# Patient Record
Sex: Female | Born: 1985 | Race: White | Hispanic: No | Marital: Single | Smoking: Current every day smoker
Health system: Southern US, Community
[De-identification: ages and names within clinical notes are randomized; demographics above are authoritative.]

## PROBLEM LIST (undated history)

## (undated) DIAGNOSIS — M797 Fibromyalgia: Secondary | ICD-10-CM

## (undated) DIAGNOSIS — C7931 Secondary malignant neoplasm of brain: Secondary | ICD-10-CM

## (undated) DIAGNOSIS — C4490 Unspecified malignant neoplasm of skin, unspecified: Secondary | ICD-10-CM

## (undated) DIAGNOSIS — M1990 Unspecified osteoarthritis, unspecified site: Secondary | ICD-10-CM

## (undated) HISTORY — PX: CHOLECYSTECTOMY: SHX55

---

## 2004-12-07 ENCOUNTER — Emergency Department (HOSPITAL_COMMUNITY): Admission: EM | Admit: 2004-12-07 | Discharge: 2004-12-07 | Payer: Self-pay | Admitting: Emergency Medicine

## 2012-02-26 ENCOUNTER — Ambulatory Visit: Payer: Self-pay | Admitting: Internal Medicine

## 2012-02-26 DIAGNOSIS — Z0279 Encounter for issue of other medical certificate: Secondary | ICD-10-CM

## 2014-05-17 ENCOUNTER — Encounter (HOSPITAL_COMMUNITY): Payer: Self-pay | Admitting: Emergency Medicine

## 2014-05-17 ENCOUNTER — Emergency Department (HOSPITAL_COMMUNITY)
Admission: EM | Admit: 2014-05-17 | Discharge: 2014-05-17 | Disposition: A | Discharge status: Home / Self Care | Payer: Self-pay | Attending: Emergency Medicine | Admitting: Emergency Medicine

## 2014-05-17 DIAGNOSIS — Z8739 Personal history of other diseases of the musculoskeletal system and connective tissue: Secondary | ICD-10-CM | POA: Insufficient documentation

## 2014-05-17 DIAGNOSIS — Z880 Allergy status to penicillin: Secondary | ICD-10-CM | POA: Insufficient documentation

## 2014-05-17 DIAGNOSIS — R110 Nausea: Secondary | ICD-10-CM | POA: Insufficient documentation

## 2014-05-17 DIAGNOSIS — K089 Disorder of teeth and supporting structures, unspecified: Secondary | ICD-10-CM | POA: Insufficient documentation

## 2014-05-17 DIAGNOSIS — K047 Periapical abscess without sinus: Secondary | ICD-10-CM | POA: Insufficient documentation

## 2014-05-17 DIAGNOSIS — F17200 Nicotine dependence, unspecified, uncomplicated: Secondary | ICD-10-CM | POA: Insufficient documentation

## 2014-05-17 HISTORY — DX: Unspecified osteoarthritis, unspecified site: M19.90

## 2014-05-17 HISTORY — DX: Fibromyalgia: M79.7

## 2014-05-17 MED ORDER — HYDROCODONE-ACETAMINOPHEN 5-325 MG PO TABS: 1.0000 | ORAL_TABLET | Freq: Four times a day (QID) | ORAL | Status: DC | PRN

## 2014-05-17 MED ORDER — CLINDAMYCIN HCL 150 MG PO CAPS: 450.0000 mg | ORAL_CAPSULE | Freq: Three times a day (TID) | ORAL | Status: DC

## 2014-05-17 NOTE — ED Notes (Addendum)
Pt reports having right sided, mandibular dental pain that started yesterday. Pt reports awaking at 0030 this morning and noticed facial swelling. Pt reports a history of dental abscesses. Pt has obvious poor dental hygiene. Pt is A/O x4 and in NAD.

## 2014-05-17 NOTE — ED Provider Notes (Signed)
CSN: [id number]     Arrival date & time 05/17/14  1451 History  This chart was scribed for non-physician practitioner, Hyman Bible, PA-C,working with Ernestina Patches, MD, by Marlowe Kays, ED Scribe. This patient was seen in room [facility] and the patient's care was started at 3:29 PM.  Chief Complaint  Patient presents with  . Dental Pain   The history is provided by the patient. No language interpreter was used.   HPI Comments:  Julia Luna is a 28 y.o. female who presents to the Emergency Department complaining of moderate right-sided dental pain that has been present since yesterday and is gradually worsening. She reports associated right-sided facial swelling, nausea and chills. She has taken Ibuprofen without significant relief. Pt denies difficulty swallowing, fever and vomiting. She reports h/o dental abscesses. She does not have a Pharmacist, community.   Past Medical History  Diagnosis Date  . Fibromyalgia   . Osteoarthritis    Past Surgical History  Procedure Laterality Date  . Cholecystectomy    . Cesarean section     No family history on file. History  Substance Use Topics  . Smoking status: Current Every Day Smoker -- 0.75 packs/day for 10 years    Types: Cigarettes  . Smokeless tobacco: Never Used  . Alcohol Use: Yes     Comment: Socially    OB History   Grav Para Term Preterm Abortions TAB SAB Ect Mult Living                 Review of Systems  Constitutional: Positive for chills. Negative for fever.  HENT: Positive for dental problem and facial swelling.   Gastrointestinal: Positive for nausea. Negative for vomiting.  All other systems reviewed and are negative.   Allergies  Other and Penicillins  Home Medications   Prior to Admission medications   Not on File   Triage Vitals: BP 127/80  Pulse 109  Temp(Src) 98.9 F (37.2 C) (Oral)  Resp 18  SpO2 100%  LMP 04/26/2014 Physical Exam  Nursing note and vitals reviewed. Constitutional: She is  oriented to person, place, and time. She appears well-developed and well-nourished. No distress.  HENT:  Head: Normocephalic and atraumatic.  Mouth/Throat: Uvula is midline, oropharynx is clear and moist and mucous membranes are normal. No trismus in the jaw. Abnormal dentition. No dental abscesses or uvula swelling. No oropharyngeal exudate, posterior oropharyngeal edema, posterior oropharyngeal erythema or tonsillar abscesses.  Poor dental hygiene. Pt able to open and close mouth with out difficulty. Airway intact. Uvula midline. Moderate gingival swelling with tenderness over affected area, but no fluctuance. Right sided facial swelling.  No swelling or tenderness of submental and submandibular regions.  No sublingual tenderness or swelling.  No tongue elevation  Neck: Normal range of motion and full passive range of motion without pain. Neck supple.  Cardiovascular: Normal rate, regular rhythm and normal heart sounds.   Pulmonary/Chest: Effort normal and breath sounds normal. No stridor.  Musculoskeletal: Normal range of motion.  Lymphadenopathy:       Head (right side): No submental, no submandibular, no tonsillar, no preauricular and no posterior auricular adenopathy present.       Head (left side): No submental, no submandibular, no tonsillar, no preauricular and no posterior auricular adenopathy present.    She has no cervical adenopathy.  Neurological: She is alert and oriented to person, place, and time.  Skin: Skin is warm and dry. No rash noted. She is not diaphoretic.  Psychiatric: She has a normal  mood and affect. Her behavior is normal.    ED Course  Procedures (including critical care time) DIAGNOSTIC STUDIES: Oxygen Saturation is 100% on RA, normal by my interpretation.   COORDINATION OF CARE: 3:33 PM- Will refer to dentist and prescribe antibiotic and pain medication. Pt verbalizes understanding and agrees to plan.  Medications - No data to display  Labs Review Labs  Reviewed - No data to display  Imaging Review No results found.   EKG Interpretation None      MDM   Final diagnoses:  None   Patient with toothache.  No abscess visualized to incise and drain.  Exam unconcerning for Ludwig's angina or spread of infection.  Will treat with antibiotic and pain medicine.  Patient given Clindamycin due to allergy to PCN.  Urged patient to follow-up with dentist.  Return precautions given.   I personally performed the services described in this documentation, which was scribed in my presence. The recorded information has been reviewed and is accurate.    Hyman Bible, PA-C 05/17/14 1607

## 2014-05-17 NOTE — Discharge Instructions (Signed)
Take pain medication as needed for severe pain.  Do not drive or operate heavy machinery for 4-6 hours after taking pain medication. °

## 2014-05-18 NOTE — ED Provider Notes (Signed)
Medical screening examination/treatment/procedure(s) were performed by non-physician practitioner and as supervising physician I was immediately available for consultation/collaboration.   Ernestina Patches, MD 05/18/14 [phone]

## 2016-10-06 DIAGNOSIS — C7931 Secondary malignant neoplasm of brain: Secondary | ICD-10-CM

## 2016-10-06 HISTORY — DX: Secondary malignant neoplasm of brain: C79.31

## 2016-10-23 ENCOUNTER — Emergency Department (HOSPITAL_COMMUNITY): Payer: Self-pay

## 2016-10-23 ENCOUNTER — Encounter (HOSPITAL_COMMUNITY): Payer: Self-pay | Admitting: Emergency Medicine

## 2016-10-23 ENCOUNTER — Inpatient Hospital Stay (HOSPITAL_COMMUNITY)
Admission: EM | Admit: 2016-10-23 | Discharge: 2016-10-28 | DRG: 054 | Disposition: A | Discharge status: Home / Self Care | Payer: Self-pay | Attending: Internal Medicine | Admitting: Internal Medicine

## 2016-10-23 ENCOUNTER — Inpatient Hospital Stay (HOSPITAL_COMMUNITY): Payer: Self-pay

## 2016-10-23 DIAGNOSIS — Z7982 Long term (current) use of aspirin: Secondary | ICD-10-CM

## 2016-10-23 DIAGNOSIS — G9341 Metabolic encephalopathy: Secondary | ICD-10-CM | POA: Diagnosis present

## 2016-10-23 DIAGNOSIS — I459 Conduction disorder, unspecified: Secondary | ICD-10-CM | POA: Diagnosis present

## 2016-10-23 DIAGNOSIS — R519 Headache, unspecified: Secondary | ICD-10-CM

## 2016-10-23 DIAGNOSIS — C7961 Secondary malignant neoplasm of right ovary: Secondary | ICD-10-CM | POA: Diagnosis present

## 2016-10-23 DIAGNOSIS — M1990 Unspecified osteoarthritis, unspecified site: Secondary | ICD-10-CM | POA: Diagnosis present

## 2016-10-23 DIAGNOSIS — C7962 Secondary malignant neoplasm of left ovary: Secondary | ICD-10-CM | POA: Diagnosis present

## 2016-10-23 DIAGNOSIS — K5900 Constipation, unspecified: Secondary | ICD-10-CM

## 2016-10-23 DIAGNOSIS — J984 Other disorders of lung: Secondary | ICD-10-CM | POA: Diagnosis present

## 2016-10-23 DIAGNOSIS — R4182 Altered mental status, unspecified: Secondary | ICD-10-CM | POA: Diagnosis present

## 2016-10-23 DIAGNOSIS — R4189 Other symptoms and signs involving cognitive functions and awareness: Secondary | ICD-10-CM | POA: Diagnosis present

## 2016-10-23 DIAGNOSIS — R51 Headache: Secondary | ICD-10-CM | POA: Diagnosis present

## 2016-10-23 DIAGNOSIS — G939 Disorder of brain, unspecified: Secondary | ICD-10-CM

## 2016-10-23 DIAGNOSIS — Z791 Long term (current) use of non-steroidal anti-inflammatories (NSAID): Secondary | ICD-10-CM

## 2016-10-23 DIAGNOSIS — Z880 Allergy status to penicillin: Secondary | ICD-10-CM

## 2016-10-23 DIAGNOSIS — Z808 Family history of malignant neoplasm of other organs or systems: Secondary | ICD-10-CM

## 2016-10-23 DIAGNOSIS — R232 Flushing: Secondary | ICD-10-CM | POA: Diagnosis present

## 2016-10-23 DIAGNOSIS — E876 Hypokalemia: Secondary | ICD-10-CM | POA: Diagnosis present

## 2016-10-23 DIAGNOSIS — C799 Secondary malignant neoplasm of unspecified site: Secondary | ICD-10-CM

## 2016-10-23 DIAGNOSIS — Z79899 Other long term (current) drug therapy: Secondary | ICD-10-CM

## 2016-10-23 DIAGNOSIS — G9340 Encephalopathy, unspecified: Secondary | ICD-10-CM | POA: Diagnosis present

## 2016-10-23 DIAGNOSIS — G936 Cerebral edema: Secondary | ICD-10-CM | POA: Diagnosis present

## 2016-10-23 DIAGNOSIS — G9389 Other specified disorders of brain: Secondary | ICD-10-CM

## 2016-10-23 DIAGNOSIS — F17210 Nicotine dependence, cigarettes, uncomplicated: Secondary | ICD-10-CM | POA: Diagnosis present

## 2016-10-23 DIAGNOSIS — C439 Malignant melanoma of skin, unspecified: Secondary | ICD-10-CM | POA: Diagnosis present

## 2016-10-23 DIAGNOSIS — R591 Generalized enlarged lymph nodes: Secondary | ICD-10-CM | POA: Diagnosis present

## 2016-10-23 DIAGNOSIS — C7989 Secondary malignant neoplasm of other specified sites: Secondary | ICD-10-CM | POA: Diagnosis present

## 2016-10-23 DIAGNOSIS — C7971 Secondary malignant neoplasm of right adrenal gland: Secondary | ICD-10-CM | POA: Diagnosis present

## 2016-10-23 DIAGNOSIS — R112 Nausea with vomiting, unspecified: Secondary | ICD-10-CM

## 2016-10-23 DIAGNOSIS — C7931 Secondary malignant neoplasm of brain: Principal | ICD-10-CM | POA: Diagnosis present

## 2016-10-23 DIAGNOSIS — R2689 Other abnormalities of gait and mobility: Secondary | ICD-10-CM

## 2016-10-23 DIAGNOSIS — Z79891 Long term (current) use of opiate analgesic: Secondary | ICD-10-CM

## 2016-10-23 DIAGNOSIS — R001 Bradycardia, unspecified: Secondary | ICD-10-CM | POA: Diagnosis present

## 2016-10-23 DIAGNOSIS — M797 Fibromyalgia: Secondary | ICD-10-CM | POA: Diagnosis present

## 2016-10-23 DIAGNOSIS — F17200 Nicotine dependence, unspecified, uncomplicated: Secondary | ICD-10-CM | POA: Diagnosis present

## 2016-10-23 DIAGNOSIS — C7972 Secondary malignant neoplasm of left adrenal gland: Secondary | ICD-10-CM | POA: Diagnosis present

## 2016-10-23 LAB — CBG MONITORING, ED: Glucose-Capillary: 111 mg/dL — ABNORMAL HIGH (ref 65–99)

## 2016-10-23 LAB — COMPREHENSIVE METABOLIC PANEL
ALBUMIN: 3.9 g/dL (ref 3.5–5.0)
ALT: 26 U/L (ref 14–54)
AST: 21 U/L (ref 15–41)
Alkaline Phosphatase: 67 U/L (ref 38–126)
Anion gap: 11 (ref 5–15)
BILIRUBIN TOTAL: 0.6 mg/dL (ref 0.3–1.2)
BUN: 6 mg/dL (ref 6–20)
CO2: 23 mmol/L (ref 22–32)
CREATININE: 0.75 mg/dL (ref 0.44–1.00)
Calcium: 9.2 mg/dL (ref 8.9–10.3)
Chloride: 103 mmol/L (ref 101–111)
GFR calc Af Amer: >60 mL/min (ref >60)
GLUCOSE: 101 mg/dL — AB (ref 65–99)
POTASSIUM: 3.2 mmol/L — AB (ref 3.5–5.1)
Sodium: 137 mmol/L (ref 135–145)
TOTAL PROTEIN: 7 g/dL (ref 6.5–8.1)

## 2016-10-23 LAB — CBC
HEMATOCRIT: 37.5 % (ref 36.0–46.0)
Hemoglobin: 13 g/dL (ref 12.0–15.0)
MCH: 31.2 pg (ref 26.0–34.0)
MCHC: 34.7 g/dL (ref 30.0–36.0)
MCV: 89.9 fL (ref 78.0–100.0)
PLATELETS: 235 10*3/uL (ref 150–400)
RBC: 4.17 MIL/uL (ref 3.87–5.11)
RDW: 12.9 % (ref 11.5–15.5)
WBC: 10.7 10*3/uL — AB (ref 4.0–10.5)

## 2016-10-23 LAB — I-STAT BETA HCG BLOOD, ED (MC, WL, AP ONLY): I-stat hCG, quantitative: <5 m[IU]/mL (ref <5)

## 2016-10-23 MED ORDER — POTASSIUM CHLORIDE IN NACL 20-0.9 MEQ/L-% IV SOLN
INTRAVENOUS | Status: DC
  Administered 2016-10-23 – 2016-10-26 (×6): via INTRAVENOUS
  Filled 2016-10-23 (×8): qty 1000

## 2016-10-23 MED ORDER — HYDROMORPHONE HCL 2 MG/ML IJ SOLN
1.0000 mg | INTRAMUSCULAR | Status: DC | PRN
  Administered 2016-10-23 – 2016-10-24 (×3): 1 mg via INTRAVENOUS
  Filled 2016-10-23 (×3): qty 1

## 2016-10-23 MED ORDER — ONDANSETRON HCL 4 MG/2ML IJ SOLN
4.0000 mg | Freq: Four times a day (QID) | INTRAMUSCULAR | Status: DC | PRN
  Administered 2016-10-26 – 2016-10-27 (×2): 4 mg via INTRAVENOUS
  Filled 2016-10-23: qty 2

## 2016-10-23 MED ORDER — FENTANYL CITRATE (PF) 100 MCG/2ML IJ SOLN
100.0000 ug | Freq: Once | INTRAMUSCULAR | Status: AC
  Administered 2016-10-23: 100 ug via INTRAVENOUS
  Filled 2016-10-23: qty 2

## 2016-10-23 MED ORDER — DEXAMETHASONE SODIUM PHOSPHATE 10 MG/ML IJ SOLN
4.0000 mg | Freq: Four times a day (QID) | INTRAMUSCULAR | Status: DC
  Administered 2016-10-23 – 2016-10-25 (×8): 4 mg via INTRAVENOUS
  Filled 2016-10-23 (×12): qty 1

## 2016-10-23 MED ORDER — LORAZEPAM 2 MG/ML IJ SOLN
1.0000 mg | INTRAMUSCULAR | Status: AC
  Administered 2016-10-23: 1 mg via INTRAVENOUS
  Filled 2016-10-23: qty 1

## 2016-10-23 MED ORDER — DEXAMETHASONE SODIUM PHOSPHATE 10 MG/ML IJ SOLN
10.0000 mg | Freq: Once | INTRAMUSCULAR | Status: AC
  Administered 2016-10-23: 10 mg via INTRAVENOUS
  Filled 2016-10-23: qty 1

## 2016-10-23 MED ORDER — ONDANSETRON HCL 4 MG PO TABS: 4.0000 mg | ORAL_TABLET | Freq: Four times a day (QID) | ORAL | Status: DC | PRN

## 2016-10-23 MED ORDER — IOPAMIDOL (ISOVUE-300) INJECTION 61%
INTRAVENOUS | Status: AC
  Administered 2016-10-23: 100 mL
  Filled 2016-10-23: qty 100

## 2016-10-23 MED ORDER — GADOBENATE DIMEGLUMINE 529 MG/ML IV SOLN
20.0000 mL | Freq: Once | INTRAVENOUS | Status: AC | PRN
  Administered 2016-10-23: 20 mL via INTRAVENOUS

## 2016-10-23 MED ORDER — SODIUM CHLORIDE 0.9% FLUSH
3.0000 mL | Freq: Two times a day (BID) | INTRAVENOUS | Status: DC
  Administered 2016-10-23 – 2016-10-28 (×7): 3 mL via INTRAVENOUS

## 2016-10-23 NOTE — ED Notes (Signed)
Patient transported to MRI 

## 2016-10-23 NOTE — Care Management Note (Signed)
Case Management Note  Patient Details  Name: MERIAN WROE MRN: [id number] Date of Birth: 1986-01-16  Subjective/Objective:      Patient presented to [facility] ED with c/o AMS HA, Difficulty walking, Head CT done in the ED reveal brain mass.                Action/Plan: ED CM reviewed patient's record, no PCP or health insurance listed. CM met with patient and parents at beside information was confirmed. 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 Parents are concerned over patient not having insurance. CM explained that a [place] will contact patient  while in hospital. Patient may need assistance with [facility] program, and follow up. Unit CM will follow up for transitional care planning.  Expected Discharge Date:                  Expected Discharge Plan:  Home/Self Care  In-House Referral:     Discharge planning Services  CM Consult, [place] [facility], [facility]  Post Acute Care Choice:    Choice offered to:  Patient  DME Arranged:    DME Agency:     HH Arranged:    [place] Agency:     Status of Service:  In process, will continue to follow  If discussed at Long Length of Stay Meetings, dates discussed:    Additional CommentsLaurena Slimmer, RN 10/23/2016, 9:54 PM

## 2016-10-23 NOTE — ED Notes (Signed)
Pt aware of need for urine  

## 2016-10-23 NOTE — ED Notes (Signed)
Patient transported to CT 

## 2016-10-23 NOTE — ED Triage Notes (Signed)
Pt here with family with some lethargy and AMS; pt sts frontal HA; family feels that she is confused; pt alert and oriented at present

## 2016-10-23 NOTE — ED Notes (Signed)
Placed pt on bedpan was unable to give urine at this time.

## 2016-10-23 NOTE — H&P (Signed)
History and Physical    [name] [id number] DOB: [birth year] DOA: 10/23/2016  PCP: No PCP Per Patient   Patient coming from: Home.  Chief Complaint: Altered mental status.  HPI: Julia Luna is a 31 y.o. female with medical history significant of fibromyalgia, osteoarthritis who comes to the ED brought by her parents with progressively worse altered mental status and headache for several days.   Apparently, the patient has not been behaving like usual. They got really concerned, when she missed work on Tuesday, which is very unusual for her. The patient has been having headaches, chills, but no fever. She is unable to provide further history at this time.   ED Course: CT scan of the head showed lesions consistent with metastasis. WBC was 10.7, potassium 3.2 mmol/L and glucose 101 mg/dL. All other values were unremarkable.  Review of Systems: As per HPI otherwise 10 point review of systems negative.    Past Medical History:  Diagnosis Date  . Fibromyalgia   . Osteoarthritis     Past Surgical History:  Procedure Laterality Date  . CESAREAN SECTION    . CHOLECYSTECTOMY       reports that she has been smoking Cigarettes.  She has a 7.50 pack-year smoking history. She has never used smokeless tobacco. She reports that she drinks alcohol. She reports that she does not use drugs.  Allergies  Allergen Reactions  . Other Hives    NO "-CILLIN(s)"  . Penicillins Hives    Has patient had a PCN reaction causing immediate rash, facial/tongue/throat swelling, SOB or lightheadedness with hypotension: Yes Has patient had a PCN reaction causing severe rash involving mucus membranes or skin necrosis: No Has patient had a PCN reaction that required hospitalization: No Has patient had a PCN reaction occurring within the last 10 years: No If all of the above answers are "NO", then may proceed with Cephalosporin use.     Family History  Problem Relation Age of Onset  . Thyroid  cancer Mother   . Cardiomyopathy Father     Prior to Admission medications   Medication Sig Start Date End Date Taking? Authorizing Provider  aspirin EC 325 MG tablet Take 325 mg by mouth daily as needed (for headaches).    Yes Historical Provider, MD  aspirin-acetaminophen-caffeine (EXCEDRIN MIGRAINE) [phone] MG tablet Take 1-2 tablets by mouth every 6 (six) hours as needed for headache or migraine.   Yes Historical Provider, MD  ibuprofen (ADVIL,MOTRIN) 200 MG tablet Take 400-600 mg by mouth every 6 (six) hours as needed for headache (or pain).    Yes Historical Provider, MD  clindamycin (CLEOCIN) 150 MG capsule Take 3 capsules (450 mg total) by mouth 3 (three) times daily. Patient not taking: Reported on 10/23/2016 05/17/14   Hyman Bible, PA-C  HYDROcodone-acetaminophen (NORCO/VICODIN) 5-325 MG per tablet Take 1-2 tablets by mouth every 6 (six) hours as needed. Patient not taking: Reported on 10/23/2016 05/17/14   Hyman Bible, PA-C    Physical Exam:  Constitutional: NAD, calm, comfortable Vitals:   10/23/16 1700 10/23/16 1715 10/23/16 1800 10/23/16 1900  BP: 132/72 114/79 136/83 144/87  Pulse: (!) 49 (!) 51 (!) 50 (!) 52  Resp: 16 17 16 15   Temp:      TempSrc:      SpO2: 97% 95% 98% 100%   Eyes: PERRL, lids and conjunctivae normal ENMT: Mucous membranes are mildly dry. Posterior pharynx clear of any exudate or lesions.dentures.  Neck: normal, supple, no masses, no thyromegaly Respiratory:  Clear to auscultation bilaterally, no wheezing, no crackles. Normal respiratory effort. No accessory muscle use.  Cardiovascular: Bradycardic at 56 BPM, no murmurs / rubs / gallops. No extremity edema. 2+ pedal pulses. No carotid bruits.  Abdomen: Mild distention, soft, no tenderness, no masses palpated. No hepatosplenomegaly. Bowel sounds positive.  Musculoskeletal: no clubbing / cyanosis.Good passive ROM, no contractures. Normal muscle tone.  Skin: facial erythema. Neurologic: CN 2-12  grossly intact. Sensation intact, DTR normal. Global Weakness  Psychiatric: Alert and oriented x 2, partially oriented to time and situation.   Labs on Admission: I have personally reviewed following labs and imaging studies  CBC:  Recent Labs Lab 10/23/16 1526  WBC 10.7*  HGB 13.0  HCT 37.5  MCV 89.9  PLT [id number]   Basic Metabolic Panel:  Recent Labs Lab 10/23/16 1526  NA 137  K 3.2*  CL 103  CO2 23  GLUCOSE 101*  BUN 6  CREATININE 0.75  CALCIUM 9.2   GFR: CrCl cannot be calculated (Unknown ideal weight.). Liver Function Tests:  Recent Labs Lab 10/23/16 1526  AST 21  ALT 26  ALKPHOS 67  BILITOT 0.6  PROT 7.0  ALBUMIN 3.9   No results for input(s): LIPASE, AMYLASE in the last 168 hours. No results for input(s): AMMONIA in the last 168 hours. Coagulation Profile: No results for input(s): INR, PROTIME in the last 168 hours. Cardiac Enzymes: No results for input(s): CKTOTAL, CKMB, CKMBINDEX, TROPONINI in the last 168 hours. BNP (last 3 results) No results for input(s): PROBNP in the last 8760 hours. HbA1C: No results for input(s): HGBA1C in the last 72 hours. CBG:  Recent Labs Lab 10/23/16 1702  GLUCAP 111*   Lipid Profile: No results for input(s): CHOL, HDL, LDLCALC, TRIG, CHOLHDL, LDLDIRECT in the last 72 hours. Thyroid Function Tests: No results for input(s): TSH, T4TOTAL, FREET4, T3FREE, THYROIDAB in the last 72 hours. Anemia Panel: No results for input(s): VITAMINB12, FOLATE, FERRITIN, TIBC, IRON, RETICCTPCT in the last 72 hours. Urine analysis: No results found for: COLORURINE, APPEARANCEUR, [place], [place], GLUCOSEU, HGBUR, BILIRUBINUR, KETONESUR, PROTEINUR, UROBILINOGEN, NITRITE, LEUKOCYTESUR   Radiological Exams on Admission: Ct Head Wo Contrast  Result Date: 10/23/2016 CLINICAL DATA:  Intermittent headache for 1 week with vomiting. EXAM: CT HEAD WITHOUT CONTRAST TECHNIQUE: Contiguous axial images were obtained from the base of the skull  through the vertex without intravenous contrast. COMPARISON:  12/07/2004 FINDINGS: Brain: There are multiple apparent hyperattenuating masses along the corticomedullary junctions of both cerebral hemispheres. In the right frontal lobe there is a 19 mm lesion. In the left posteromedial parietal lobe there is an 18 mm lesion. In the right parietal lobe superiorly and medially there is a 2.3 cm lesion. There multiple additional smaller lesions. There is surrounding white matter hypoattenuation consistent with vasogenic edema. There is no midline shift. There is some mass effect reflected by sulcal effacement. The ventricles are normal in overall size and configuration. There is a questionable small lesion in the right cerebellum. There is no evidence of an infarct. There are no extra-axial masses or abnormal fluid collections. There is no intracranial hemorrhage. Vascular: No hyperdense vessel or unexpected calcification. Skull: Normal. Negative for fracture or focal lesion. Sinuses/Orbits: Globes and orbits unremarkable. Visualized sinuses are essentially clear. Other: None. IMPRESSION: 1. Now multiple relatively hyperattenuating brain masses with associated vasogenic edema consistent with metastatic disease. Followup brain MRI with and without contrast is recommended. Electronically Signed   By: Lajean Manes M.D.   On: 10/23/2016 16:19  EKG: Independently reviewed. Ordered.  Assessment/Plan Principal Problem:   Brain mass Continue Decadron. Frequent neuro checks Will get a CT scan of the chest/abdomen/pelvis to try to find primary. 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 Consult oncology, GYN and/or interventional radiology in a.m. Consider neurology evaluation if no improvement of mental status with IV dexamethasone.  Active Problems:   Hypokalemia Replacing. Check magnesium level.    Bradycardia Will get EKG in the morning.    Tobacco  use disorder Nicotine replacement therapy as needed.    DVT prophylaxis: Lovenox. Code Status: Full code. Family Communication: Her parent were present in the room and provided history. Disposition Plan: Admit for further work up and symptoms treatment. Consults called:  Admission status: Inpatient/Telemetry.   Reubin Milan MD [facility] Pager [phone]  If 7PM-7AM, please contact night-coverage [email or web address] Password [email or web address]  10/23/2016, 8:02 PM   Late entry  CLINICAL DATA:  Diagnosed with brain mass today.  EXAM: CT CHEST, ABDOMEN, AND PELVIS WITH CONTRAST  TECHNIQUE: Multidetector CT imaging of the chest, abdomen and pelvis was performed following the standard protocol during bolus administration of intravenous contrast.  CONTRAST:  11mL ISOVUE-300 IOPAMIDOL (ISOVUE-300) INJECTION 61%  COMPARISON:  None.  FINDINGS: CT CHEST FINDINGS  Cardiovascular: Heart size is normal. No pericardial effusion. Thoracic aorta is normal in caliber.  Mediastinum/Nodes: No mass or enlarged lymph nodes seen within the mediastinum, perihilar or axillary regions. Esophagus appears normal. Thyroid gland appears normal. Trachea and central bronchi are unremarkable.  Lungs/Pleura: Scattered pulmonary nodules scattered throughout both lungs, at least 10 on the right and at least 7 on the left.  Largest pulmonary nodule is within the right upper lobe measuring 2.2 x 2.2 x 1.7 cm (series 40, image 54). Largest nodule on the left is located within the left upper lobe, inferior segment posteriorly, measuring 2 x 1.6 x 1.5 cm (series 4, image 47).  There are also small bilateral effusions with adjacent atelectasis.  Musculoskeletal: No acute or suspicious osseous finding. Superficial soft tissues are unremarkable.  CT ABDOMEN PELVIS FINDINGS  Hepatobiliary: Status post cholecystectomy. No focal mass or lesion within the liver. No bile duct  dilatation.  Pancreas: Unremarkable. No pancreatic ductal dilatation or surrounding inflammatory changes.  Spleen: Normal in size without focal abnormality.  Adrenals/Urinary Tract: Bilateral adrenal masses, measuring approximately 2.4 x 2.2 cm on the left and approximately 2 x 1.4 cm on the right.  Both kidneys appear normal without mass, stone or hydronephrosis. No ureteral or bladder calculi identified. Bladder appears normal.  Stomach/Bowel: Bowel is normal in caliber. No bowel wall thickening or evidence of bowel wall inflammation seen. Appendix appears normal. Stomach appears normal.  Vascular/Lymphatic: No vascular abnormality appreciated. Scattered small lymph nodes noted within the bilateral iliac chain regions and retroperitoneum. No pathologically enlarged lymph nodes appreciated within the abdomen or pelvis.  There is an enlarged/morphologically abnormal lymph node within the left inguinal region, with central hypodensity suspicious for necrosis, measuring 3.5 x 2.3 cm.  Reproductive: Solid-appearing mass within the left adnexal region measures 3.2 x 2.3 cm, likely contiguous with the left ovary. Additional cystic-appearing mass within the right adnexal region measures 6.6 x 4.2 cm, likely contiguous with the right ovary.  Small amount of free fluid within the adjacent cul-de-sac.  Other: None  Musculoskeletal: No acute or suspicious osseous finding.  IMPRESSION: 1. Multiple scattered pulmonary nodules within each lung, consistent with metastases. Largest nodule is located within the right  upper lobe measuring 2.2 x 2.2 x 1.7 cm. 2. Small bilateral pleural effusions. 3. Solid-appearing mass within the left adnexal region measuring 3.2 x 2.3 cm. Additional cystic-appearing mass within the right adnexal region measuring 6.6 x 4.2 cm. 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 4. Enlarged/morphologically abnormal lymph node  within the left inguinal region, with central hypodensity suspicious for necrosis, highly suspicious for metastatic lymph node. 5. Bilateral adrenal masses, also suspicious for metastatic disease.   Electronically Signed   By: Franki Cabot M.D.   On: 10/23/2016 22:26

## 2016-10-23 NOTE — ED Provider Notes (Signed)
[facility] DEPT Provider Note   CSN: [id number] Arrival date & time: 10/23/16  1453     History   Chief Complaint Chief Complaint  Patient presents with  . Headache    HPI VANITA GENSEMER is a 31 y.o. female.  HPI patient presents with headache and some confusion and difficulty walking. Reportedly has been going on for about the last week. Brought in by family members. Has had a dull headache. States it has come and gone. No trauma. Has had some chills but no fevers. Reportedly gets a little flushed and then will get something cold and then she'll get goosebumps. No weight loss. No nausea or vomiting. Does have a history of fibromyalgia and has some chronic joint pain. She is a smoker. Has had difficulty walking and his been unsteady. No history of malignancy. Denies possibility of pregnancy.  Past Medical History:  Diagnosis Date  . Fibromyalgia   . Osteoarthritis     There are no active problems to display for this patient.   Past Surgical History:  Procedure Laterality Date  . CESAREAN SECTION    . CHOLECYSTECTOMY      OB History    No data available       Home Medications    Prior to Admission medications   Medication Sig Start Date End Date Taking? Authorizing Provider  clindamycin (CLEOCIN) 150 MG capsule Take 3 capsules (450 mg total) by mouth 3 (three) times daily. 05/17/14   Hyman Bible, PA-C  HYDROcodone-acetaminophen (NORCO/VICODIN) 5-325 MG per tablet Take 1-2 tablets by mouth every 6 (six) hours as needed. 05/17/14   Heather Laisure, PA-C  ibuprofen (ADVIL,MOTRIN) 200 MG tablet Take 400-600 mg by mouth every 6 (six) hours as needed (For pain.).    Historical Provider, MD    Family History History reviewed. No pertinent family history.  Social History Social History  Substance Use Topics  . Smoking status: Current Every Day Smoker    Packs/day: 0.75    Years: 10.00    Types: Cigarettes  . Smokeless tobacco: Never Used  . Alcohol use Yes       Comment: Socially      Allergies   Other and Penicillins   Review of Systems Review of Systems  Constitutional: Positive for chills. Negative for appetite change.  HENT: Negative for congestion.   Eyes: Negative for photophobia and visual disturbance.  Cardiovascular: Negative for chest pain.  Gastrointestinal: Negative for abdominal pain.  Genitourinary: Negative for flank pain.  Musculoskeletal: Positive for arthralgias. Negative for back pain.  Skin: Positive for rash.  Neurological: Positive for weakness and headaches. Negative for speech difficulty.  Hematological: Negative for adenopathy.     Physical Exam Updated Vital Signs BP 124/79 (BP Location: Right Arm)   Pulse (!) 57   Temp 98.4 F (36.9 C) (Oral)   Resp 16   SpO2 100%   Physical Exam  Constitutional: She appears well-developed.  HENT:  Head: Normocephalic.  Eyes: Pupils are equal, round, and reactive to light.  Neck: Neck supple.  Cardiovascular: Normal rate.   Pulmonary/Chest: Effort normal.  Abdominal: There is no tenderness.  Musculoskeletal: She exhibits no edema.  Neurological: She is alert.  Patient is awake but somewhat slow to answer. She is appropriate and able to recognize her family. She did however have to look over to see who was in the room with her. Finger-nose intact but slow.  Skin: Skin is warm. Capillary refill takes less than 2 seconds.  ED Treatments / Results  Labs (all labs ordered are listed, but only abnormal results are displayed) Labs Reviewed  COMPREHENSIVE METABOLIC PANEL - Abnormal; Notable for the following:       Result Value   Potassium 3.2 (*)    Glucose, Bld 101 (*)    All other components within normal limits  CBC - Abnormal; Notable for the following:    WBC 10.7 (*)    All other components within normal limits  PREGNANCY, URINE  URINALYSIS, ROUTINE W REFLEX MICROSCOPIC  CBG MONITORING, ED    EKG  EKG Interpretation None        Radiology Ct Head Wo Contrast  Result Date: 10/23/2016 CLINICAL DATA:  Intermittent headache for 1 week with vomiting. EXAM: CT HEAD WITHOUT CONTRAST TECHNIQUE: Contiguous axial images were obtained from the base of the skull through the vertex without intravenous contrast. COMPARISON:  12/07/2004 FINDINGS: Brain: There are multiple apparent hyperattenuating masses along the corticomedullary junctions of both cerebral hemispheres. In the right frontal lobe there is a 19 mm lesion. In the left posteromedial parietal lobe there is an 18 mm lesion. In the right parietal lobe superiorly and medially there is a 2.3 cm lesion. There multiple additional smaller lesions. There is surrounding white matter hypoattenuation consistent with vasogenic edema. There is no midline shift. There is some mass effect reflected by sulcal effacement. The ventricles are normal in overall size and configuration. There is a questionable small lesion in the right cerebellum. There is no evidence of an infarct. There are no extra-axial masses or abnormal fluid collections. There is no intracranial hemorrhage. Vascular: No hyperdense vessel or unexpected calcification. Skull: Normal. Negative for fracture or focal lesion. Sinuses/Orbits: Globes and orbits unremarkable. Visualized sinuses are essentially clear. Other: None. IMPRESSION: 1. Now multiple relatively hyperattenuating brain masses with associated vasogenic edema consistent with metastatic disease. Followup brain MRI with and without contrast is recommended. Electronically Signed   By: Lajean Manes M.D.   On: 10/23/2016 16:19    Procedures Procedures (including critical care time)  Medications Ordered in ED Medications  fentaNYL (SUBLIMAZE) injection 100 mcg (not administered)     Initial Impression / Assessment and Plan / ED Course  I have reviewed the triage vital signs and the nursing notes.  Pertinent labs & imaging results that were available during my  care of the patient were reviewed by me and considered in my medical decision making (see chart for details).     Patient with headaches. Some confusion. Has been going for the last week. Smoker but no malignancy history. Likely tumors on MRI. No fevers but had had a little bit of chills. No primary care doctor. Will admit to internal medicine.  Final Clinical Impressions(s) / ED Diagnoses   Final diagnoses:  Brain mass  Acute nonintractable headache, unspecified headache type    New Prescriptions New Prescriptions   No medications on file     Davonna Belling, MD 10/23/16 1704

## 2016-10-24 ENCOUNTER — Inpatient Hospital Stay (HOSPITAL_COMMUNITY): Payer: Self-pay

## 2016-10-24 ENCOUNTER — Encounter (HOSPITAL_COMMUNITY): Payer: Self-pay | Admitting: General Practice

## 2016-10-24 DIAGNOSIS — N839 Noninflammatory disorder of ovary, fallopian tube and broad ligament, unspecified: Secondary | ICD-10-CM

## 2016-10-24 DIAGNOSIS — R51 Headache: Secondary | ICD-10-CM

## 2016-10-24 DIAGNOSIS — R911 Solitary pulmonary nodule: Secondary | ICD-10-CM

## 2016-10-24 DIAGNOSIS — C799 Secondary malignant neoplasm of unspecified site: Secondary | ICD-10-CM

## 2016-10-24 DIAGNOSIS — R4182 Altered mental status, unspecified: Secondary | ICD-10-CM

## 2016-10-24 DIAGNOSIS — R599 Enlarged lymph nodes, unspecified: Secondary | ICD-10-CM

## 2016-10-24 DIAGNOSIS — R410 Disorientation, unspecified: Secondary | ICD-10-CM

## 2016-10-24 DIAGNOSIS — E279 Disorder of adrenal gland, unspecified: Secondary | ICD-10-CM

## 2016-10-24 LAB — CBC WITH DIFFERENTIAL/PLATELET
Basophils Absolute: 0 K/uL (ref 0.0–0.1)
Basophils Relative: 0 %
Eosinophils Absolute: 0 K/uL (ref 0.0–0.7)
Eosinophils Relative: 0 %
HCT: 38.6 % (ref 36.0–46.0)
Hemoglobin: 13.4 g/dL (ref 12.0–15.0)
Lymphocytes Relative: 16 %
Lymphs Abs: 1.1 K/uL (ref 0.7–4.0)
MCH: 31.1 pg (ref 26.0–34.0)
MCHC: 34.7 g/dL (ref 30.0–36.0)
MCV: 89.6 fL (ref 78.0–100.0)
Monocytes Absolute: 0.3 K/uL (ref 0.1–1.0)
Monocytes Relative: 4 %
Neutro Abs: 5.6 K/uL (ref 1.7–7.7)
Neutrophils Relative %: 80 %
Platelets: 211 K/uL (ref 150–400)
RBC: 4.31 MIL/uL (ref 3.87–5.11)
RDW: 12.8 % (ref 11.5–15.5)
WBC: 7 K/uL (ref 4.0–10.5)

## 2016-10-24 LAB — URINALYSIS, ROUTINE W REFLEX MICROSCOPIC
Bilirubin Urine: NEGATIVE
GLUCOSE, UA: NEGATIVE mg/dL
Hgb urine dipstick: NEGATIVE
Ketones, ur: 20 mg/dL — AB
LEUKOCYTES UA: NEGATIVE
Nitrite: NEGATIVE
PH: 6 (ref 5.0–8.0)
Protein, ur: NEGATIVE mg/dL
Specific Gravity, Urine: >1.046 — ABNORMAL HIGH (ref 1.005–1.030)

## 2016-10-24 LAB — BASIC METABOLIC PANEL WITH GFR
Anion gap: 11 (ref 5–15)
BUN: 8 mg/dL (ref 6–20)
CO2: 23 mmol/L (ref 22–32)
Calcium: 8.5 mg/dL — ABNORMAL LOW (ref 8.9–10.3)
Chloride: 103 mmol/L (ref 101–111)
Creatinine, Ser: 0.74 mg/dL (ref 0.44–1.00)
GFR calc Af Amer: >60 mL/min
GFR calc non Af Amer: >60 mL/min
Glucose, Bld: 110 mg/dL — ABNORMAL HIGH (ref 65–99)
Potassium: 3.5 mmol/L (ref 3.5–5.1)
Sodium: 137 mmol/L (ref 135–145)

## 2016-10-24 LAB — PROTIME-INR
INR: 1.15
PROTHROMBIN TIME: 14.8 s (ref 11.4–15.2)

## 2016-10-24 LAB — MAGNESIUM: Magnesium: 2.2 mg/dL (ref 1.7–2.4)

## 2016-10-24 MED ORDER — MIDAZOLAM HCL 2 MG/2ML IJ SOLN
INTRAMUSCULAR | Status: AC | PRN
  Administered 2016-10-24: 1 mg via INTRAVENOUS

## 2016-10-24 MED ORDER — MIDAZOLAM HCL 2 MG/2ML IJ SOLN
INTRAMUSCULAR | Status: AC
  Filled 2016-10-24: qty 2

## 2016-10-24 MED ORDER — MAGNESIUM SULFATE 2 GM/50ML IV SOLN
2.0000 g | Freq: Once | INTRAVENOUS | Status: AC
  Administered 2016-10-24: 2 g via INTRAVENOUS
  Filled 2016-10-24: qty 50

## 2016-10-24 MED ORDER — LIDOCAINE HCL 1 % IJ SOLN
INTRAMUSCULAR | Status: AC
  Filled 2016-10-24: qty 20

## 2016-10-24 MED ORDER — HYDROMORPHONE HCL 2 MG/ML IJ SOLN
1.0000 mg | INTRAMUSCULAR | Status: DC | PRN
  Administered 2016-10-24 – 2016-10-25 (×5): 1 mg via INTRAVENOUS
  Filled 2016-10-24 (×6): qty 1

## 2016-10-24 MED ORDER — FENTANYL CITRATE (PF) 100 MCG/2ML IJ SOLN
INTRAMUSCULAR | Status: AC
  Filled 2016-10-24: qty 2

## 2016-10-24 MED ORDER — FENTANYL CITRATE (PF) 100 MCG/2ML IJ SOLN
INTRAMUSCULAR | Status: AC | PRN
  Administered 2016-10-24: 25 ug via INTRAVENOUS

## 2016-10-24 MED ORDER — OXYCODONE-ACETAMINOPHEN 5-325 MG PO TABS
1.0000 | ORAL_TABLET | Freq: Four times a day (QID) | ORAL | Status: DC | PRN
  Administered 2016-10-24 (×2): 1 via ORAL
  Filled 2016-10-24 (×3): qty 1

## 2016-10-24 NOTE — Progress Notes (Signed)
Patient unable to void after being admitted last night. Patient stated that she felt that she needed to pee but couldn't. Bladder scan showed >571 mL of urine in bladder. Paged [place],MD about patient. MD placed order for in and out cath. 2 RNs completed in and out cath. 632mL of urine was drained from patient's bladder.

## 2016-10-24 NOTE — Progress Notes (Signed)
Chief Complaint: Patient was seen in consultation today for biopsy of LN at the request of Dr. Daleen Bo  Referring Physician(s): Dr. Ivette Loyal  Supervising Physician: Marybelle Killings  Patient Status: [facility] - In-pt  History of Present Illness: Julia Luna is a 31 y.o. female who has been admitted with altered mental status and headache. Her workup is now suspicious for metastatic process with findings of lesions in the pelvis, lung, brain , and lymph nodes. IR is asked to obtain tissue sampling. Chart, imaging, meds, labs, allergies reviewed. Dr. Barbie Banner feels large (L)inguinal is amenable for core biopsy. Parents at bedside. Pt s/o mild nausea after getting up tom commode. She did eat some breakfast this am before 0900.  Past Medical History:  Diagnosis Date  . Fibromyalgia   . Osteoarthritis     Past Surgical History:  Procedure Laterality Date  . CESAREAN SECTION    . CHOLECYSTECTOMY      Allergies: Other and Penicillins  Medications:  Current Facility-Administered Medications:  .  0.9 % NaCl with KCl 20 mEq/ L  infusion, , Intravenous, Continuous, Kinnie Feil, MD, Last Rate: 75 mL/hr at 10/24/16 0928 .  dexamethasone (DECADRON) injection 4 mg, 4 mg, Intravenous, Q6H, Reubin Milan, MD, 4 mg at 10/24/16 0511 .  HYDROmorphone (DILAUDID) injection 1 mg, 1 mg, Intravenous, Q4H PRN, Reubin Milan, MD, 1 mg at 10/24/16 [id number] .  ondansetron (ZOFRAN) tablet 4 mg, 4 mg, Oral, Q6H PRN **OR** ondansetron (ZOFRAN) injection 4 mg, 4 mg, Intravenous, Q6H PRN, Reubin Milan, MD .  sodium chloride flush (NS) 0.9 % injection 3 mL, 3 mL, Intravenous, Q12H, Reubin Milan, MD, Stopped at 10/24/16 [phone]    Family History  Problem Relation Age of Onset  . Thyroid cancer Mother   . Cardiomyopathy Father     Social History   Social History  . Marital status: Single    Spouse name: N/A  . Number of children: N/A  . Years of education: N/A   Social  History Main Topics  . Smoking status: Current Every Day Smoker    Packs/day: 0.75    Years: 10.00    Types: Cigarettes  . Smokeless tobacco: Never Used  . Alcohol use Yes     Comment: Socially   . Drug use: No  . Sexual activity: Not Asked   Other Topics Concern  . None   Social History Narrative  . None    Review of Systems: A 12 point ROS discussed and pertinent positives are indicated in the HPI above.  All other systems are negative.  Review of Systems  Vital Signs: BP 138/68 (BP Location: Left Arm)   Pulse (!) 54   Temp 99 F (37.2 C) (Oral)   Resp 18   SpO2 96%   Physical Exam  Constitutional: She is oriented to person, place, and time. She appears well-developed and well-nourished. No distress.  HENT:  Head: Normocephalic.  Mouth/Throat: Oropharynx is clear and moist.  Neck: Normal range of motion. No tracheal deviation present.  Cardiovascular: Normal rate, regular rhythm and normal heart sounds.   Pulmonary/Chest: Effort normal and breath sounds normal. No respiratory distress.  Abdominal: Soft. There is no tenderness.  Inguinal nodes seen on CT not easily palpable  Neurological: She is alert and oriented to person, place, and time.  Psychiatric: She has a normal mood and affect.    Mallampati Score:  MD Evaluation Airway: WNL Heart: WNL Abdomen: WNL Chest/ Lungs: WNL  ASA  Classification: 2 Mallampati/Airway Score: One  Imaging: Ct Head Wo Contrast  Result Date: 10/23/2016 CLINICAL DATA:  Intermittent headache for 1 week with vomiting. EXAM: CT HEAD WITHOUT CONTRAST TECHNIQUE: Contiguous axial images were obtained from the base of the skull through the vertex without intravenous contrast. COMPARISON:  12/07/2004 FINDINGS: Brain: There are multiple apparent hyperattenuating masses along the corticomedullary junctions of both cerebral hemispheres. In the right frontal lobe there is a 19 mm lesion. In the left posteromedial parietal lobe there is an 18  mm lesion. In the right parietal lobe superiorly and medially there is a 2.3 cm lesion. There multiple additional smaller lesions. There is surrounding white matter hypoattenuation consistent with vasogenic edema. There is no midline shift. There is some mass effect reflected by sulcal effacement. The ventricles are normal in overall size and configuration. There is a questionable small lesion in the right cerebellum. There is no evidence of an infarct. There are no extra-axial masses or abnormal fluid collections. There is no intracranial hemorrhage. Vascular: No hyperdense vessel or unexpected calcification. Skull: Normal. Negative for fracture or focal lesion. Sinuses/Orbits: Globes and orbits unremarkable. Visualized sinuses are essentially clear. Other: None. IMPRESSION: 1. Now multiple relatively hyperattenuating brain masses with associated vasogenic edema consistent with metastatic disease. Followup brain MRI with and without contrast is recommended. Electronically Signed   By: Lajean Manes M.D.   On: 10/23/2016 16:19   Ct Chest W Contrast  Result Date: 10/23/2016 CLINICAL DATA:  Diagnosed with brain mass today. EXAM: CT CHEST, ABDOMEN, AND PELVIS WITH CONTRAST TECHNIQUE: Multidetector CT imaging of the chest, abdomen and pelvis was performed following the standard protocol during bolus administration of intravenous contrast. CONTRAST:  141mL ISOVUE-300 IOPAMIDOL (ISOVUE-300) INJECTION 61% COMPARISON:  None. FINDINGS: CT CHEST FINDINGS Cardiovascular: Heart size is normal. No pericardial effusion. Thoracic aorta is normal in caliber. Mediastinum/Nodes: No mass or enlarged lymph nodes seen within the mediastinum, perihilar or axillary regions. Esophagus appears normal. Thyroid gland appears normal. Trachea and central bronchi are unremarkable. Lungs/Pleura: Scattered pulmonary nodules scattered throughout both lungs, at least 10 on the right and at least 7 on the left. Largest pulmonary nodule is within  the right upper lobe measuring 2.2 x 2.2 x 1.7 cm (series 40, image 54). Largest nodule on the left is located within the left upper lobe, inferior segment posteriorly, measuring 2 x 1.6 x 1.5 cm (series 4, image 47). There are also small bilateral effusions with adjacent atelectasis. Musculoskeletal: No acute or suspicious osseous finding. Superficial soft tissues are unremarkable. CT ABDOMEN PELVIS FINDINGS Hepatobiliary: Status post cholecystectomy. No focal mass or lesion within the liver. No bile duct dilatation. Pancreas: Unremarkable. No pancreatic ductal dilatation or surrounding inflammatory changes. Spleen: Normal in size without focal abnormality. Adrenals/Urinary Tract: Bilateral adrenal masses, measuring approximately 2.4 x 2.2 cm on the left and approximately 2 x 1.4 cm on the right. Both kidneys appear normal without mass, stone or hydronephrosis. No ureteral or bladder calculi identified. Bladder appears normal. Stomach/Bowel: Bowel is normal in caliber. No bowel wall thickening or evidence of bowel wall inflammation seen. Appendix appears normal. Stomach appears normal. Vascular/Lymphatic: No vascular abnormality appreciated. Scattered small lymph nodes noted within the bilateral iliac chain regions and retroperitoneum. No pathologically enlarged lymph nodes appreciated within the abdomen or pelvis. There is an enlarged/morphologically abnormal lymph node within the left inguinal region, with central hypodensity suspicious for necrosis, measuring 3.5 x 2.3 cm. Reproductive: Solid-appearing mass within the left adnexal region measures 3.2 x  2.3 cm, likely contiguous with the left ovary. Additional cystic-appearing mass within the right adnexal region measures 6.6 x 4.2 cm, likely contiguous with the right ovary. Small amount of free fluid within the adjacent cul-de-sac. Other: None Musculoskeletal: No acute or suspicious osseous finding. IMPRESSION: 1. Multiple scattered pulmonary nodules within each  lung, consistent with metastases. Largest nodule is located within the right upper lobe measuring 2.2 x 2.2 x 1.7 cm. 2. Small bilateral pleural effusions. 3. Solid-appearing mass within the left adnexal region measuring 3.2 x 2.3 cm. Additional cystic-appearing mass within the right adnexal region measuring 6.6 x 4.2 cm. 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 4. Enlarged/morphologically abnormal lymph node within the left inguinal region, with central hypodensity suspicious for necrosis, highly suspicious for metastatic lymph node. 5. Bilateral adrenal masses, also suspicious for metastatic disease. Electronically Signed   By: Franki Cabot M.D.   On: 10/23/2016 22:26   Mr Jeri Cos And Wo Contrast  Result Date: 10/23/2016 CLINICAL DATA:  Headache, confusion, difficulty walking beginning last week. Follow-up brain mass. EXAM: MRI HEAD WITHOUT AND WITH CONTRAST TECHNIQUE: Multiplanar, multiecho pulse sequences of the brain and surrounding structures were obtained without and with intravenous contrast. CONTRAST:  37mL MULTIHANCE GADOBENATE DIMEGLUMINE 529 MG/ML IV SOLN COMPARISON:  CT HEAD October 23, 2016 at 1600 hours. FINDINGS: INTRACRANIAL CONTENTS: Greater than 100 supra- and infratentorial parenchymal rounded enhancing masses with reduced diffusion within the larger lesions, their able T1 shortening and faint susceptibility artifact. Associated vasogenic edema. Largest lesions are 2.2 cm bilateral parietal lobes, 2.4 cm RIGHT frontal lobe. Local mass effect without midline shift. No extra-axial mass or abnormal enhancement. Ventricles and sulci are overall normal for patient's age. No abnormal extra-axial fluid collections. VASCULAR: Normal major intracranial vascular flow voids present at skull base. SKULL AND UPPER CERVICAL SPINE: No abnormal sellar expansion. No suspicious calvarial bone marrow signal. Craniocervical junction maintained. SINUSES/ORBITS: Trace mastoid effusions. The  included ocular globes and orbital contents are non-suspicious. OTHER: None. IMPRESSION: Numerous supra- and infratentorial metastasis, with imaging characteristics of melanoma or mildly hemorrhagic metastasis. Local mass effect without midline shift. Acute findings discussed with and reconfirmed by Dr.NATHAN PICKERING on 10/23/2016 at 8:55 p.m. Electronically Signed   By: Elon Alas M.D.   On: 10/23/2016 21:07   Ct Abdomen Pelvis W Contrast  Result Date: 10/23/2016 CLINICAL DATA:  Diagnosed with brain mass today. EXAM: CT CHEST, ABDOMEN, AND PELVIS WITH CONTRAST TECHNIQUE: Multidetector CT imaging of the chest, abdomen and pelvis was performed following the standard protocol during bolus administration of intravenous contrast. CONTRAST:  19mL ISOVUE-300 IOPAMIDOL (ISOVUE-300) INJECTION 61% COMPARISON:  None. FINDINGS: CT CHEST FINDINGS Cardiovascular: Heart size is normal. No pericardial effusion. Thoracic aorta is normal in caliber. Mediastinum/Nodes: No mass or enlarged lymph nodes seen within the mediastinum, perihilar or axillary regions. Esophagus appears normal. Thyroid gland appears normal. Trachea and central bronchi are unremarkable. Lungs/Pleura: Scattered pulmonary nodules scattered throughout both lungs, at least 10 on the right and at least 7 on the left. Largest pulmonary nodule is within the right upper lobe measuring 2.2 x 2.2 x 1.7 cm (series 40, image 54). Largest nodule on the left is located within the left upper lobe, inferior segment posteriorly, measuring 2 x 1.6 x 1.5 cm (series 4, image 47). There are also small bilateral effusions with adjacent atelectasis. Musculoskeletal: No acute or suspicious osseous finding. Superficial soft tissues are unremarkable. CT ABDOMEN PELVIS FINDINGS Hepatobiliary: Status post cholecystectomy. No focal mass or  lesion within the liver. No bile duct dilatation. Pancreas: Unremarkable. No pancreatic ductal dilatation or surrounding inflammatory  changes. Spleen: Normal in size without focal abnormality. Adrenals/Urinary Tract: Bilateral adrenal masses, measuring approximately 2.4 x 2.2 cm on the left and approximately 2 x 1.4 cm on the right. Both kidneys appear normal without mass, stone or hydronephrosis. No ureteral or bladder calculi identified. Bladder appears normal. Stomach/Bowel: Bowel is normal in caliber. No bowel wall thickening or evidence of bowel wall inflammation seen. Appendix appears normal. Stomach appears normal. Vascular/Lymphatic: No vascular abnormality appreciated. Scattered small lymph nodes noted within the bilateral iliac chain regions and retroperitoneum. No pathologically enlarged lymph nodes appreciated within the abdomen or pelvis. There is an enlarged/morphologically abnormal lymph node within the left inguinal region, with central hypodensity suspicious for necrosis, measuring 3.5 x 2.3 cm. Reproductive: Solid-appearing mass within the left adnexal region measures 3.2 x 2.3 cm, likely contiguous with the left ovary. Additional cystic-appearing mass within the right adnexal region measures 6.6 x 4.2 cm, likely contiguous with the right ovary. Small amount of free fluid within the adjacent cul-de-sac. Other: None Musculoskeletal: No acute or suspicious osseous finding. IMPRESSION: 1. Multiple scattered pulmonary nodules within each lung, consistent with metastases. Largest nodule is located within the right upper lobe measuring 2.2 x 2.2 x 1.7 cm. 2. Small bilateral pleural effusions. 3. Solid-appearing mass within the left adnexal region measuring 3.2 x 2.3 cm. Additional cystic-appearing mass within the right adnexal region measuring 6.6 x 4.2 cm. 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 4. Enlarged/morphologically abnormal lymph node within the left inguinal region, with central hypodensity suspicious for necrosis, highly suspicious for metastatic lymph node. 5. Bilateral adrenal masses, also  suspicious for metastatic disease. Electronically Signed   By: Franki Cabot M.D.   On: 10/23/2016 22:26    Labs:  CBC:  Recent Labs  10/23/16 1526 10/24/16 0615  WBC 10.7* 7.0  HGB 13.0 13.4  HCT 37.5 38.6  PLT 235 211    COAGS: No results for input(s): INR, APTT in the last 8760 hours.  BMP:  Recent Labs  10/23/16 1526 10/24/16 0615  NA 137 137  K 3.2* 3.5  CL 103 103  CO2 23 23  GLUCOSE 101* 110*  BUN 6 8  CALCIUM 9.2 8.5*  CREATININE 0.75 0.74  GFRNONAA >60 >60  GFRAA >60 >60    LIVER FUNCTION TESTS:  Recent Labs  10/23/16 1526  BILITOT 0.6  AST 21  ALT 26  ALKPHOS 67  PROT 7.0  ALBUMIN 3.9    TUMOR MARKERS: No results for input(s): AFPTM, CEA, CA199, CHROMGRNA in the last 8760 hours.  Assessment and Plan: Metastatic process, uncertain primary etiology. Plan for US guided biopsy of (L)inguinal LN today Pt ate breakfast so will keep NPO until procedure later today Labs reviewed, will check INR. Pt and family understand plan Risks and Benefits discussed with the patient including, but not limited to bleeding, infection, damage to adjacent structures or low yield requiring additional tests. All of the patient's questions were answered, patient is agreeable to proceed. Consent signed and in chart.   Thank you for this interesting consult.  I greatly enjoyed meeting ALXIS BRODNAX and look forward to participating in their care.  A copy of this report was sent to the requesting provider on this date.  Electronically Signed: Ascencion Dike 10/24/2016, 9:35 AM   I spent a total of 20 minutes in face to face in clinical consultation, greater  than 50% of which was counseling/coordinating care for LN biopsy

## 2016-10-24 NOTE — Progress Notes (Signed)
Paged Ortiz,MD about patient having pausing in her heartbeat per tele. Longest pause was 2.01 seconds. Ortiz,MD returned page and stated these pauses can happen while she is sleeping, but to notify him if the pauses become longer. Ortiz,MD also ordered a magnesium level to be drawn on the patient. Vital signs taken and were stable. Will continue to monitor and treat per MD orders.

## 2016-10-24 NOTE — Progress Notes (Signed)
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  -MRI brain: Numerous supra- and infratentorial metastasis, with imagingcharacteristics of melanoma or mildly hemorrhagic metastasis. Local mass effect without midline shift. -CT abd/pelvis/chest: Multiple scattered pulmonary nodules within each lung, Solid-appearing mass within the left adnexal region Additional cystic-appearing mass within the right adnexalregion Enlarged/morphologically abnormal lymph node within the left inguinal region, with central hypodensity suspicious for necrosis Bilateral adrenal masses  D/w oncology. Who recommended IR biopsy. D/w IR who will biopsy left inguinal lymph node. We will cont iv decadron today. Prognosis is guarded with metastatic cancerr. may need palliative care.   Code Status: full Family Communication: d/w patient, her family at the bedside (indicate person spoken with, relationship, and if by phone, the number) Disposition Plan: pend further workup    Consultants:  Oncology  IR  Procedures:  Pend biopsy   Antibiotics:  none (indicate start date, and stop date if known)  HPI/Subjective: Alert, oriented. Reports mild frontal headaches. No focal symptoms   Objective: Vitals:   10/24/16 0127 10/24/16 0538  BP: 138/77 138/68  Pulse: 64 (!) 54  Resp:  18  Temp:  99 F (37.2 C)    Intake/Output Summary (Last 24 hours) at 10/24/16 0850 Last data filed at 10/24/16 0800  Gross per 24 hour  Intake          1094.67 ml  Output              650 ml  Net           444.67 ml   There were no vitals filed for this visit.  Exam:   General:  No distress   Cardiovascular: s1,s2 rrr  Respiratory: CTA BL  Abdomen: soft, nt,nd    Musculoskeletal: no leg edema    Data Reviewed: Basic Metabolic Panel:  Recent Labs Lab 10/23/16 1526 10/24/16 0615  NA 137 137  K 3.2* 3.5  CL 103 103  CO2 23 23  GLUCOSE 101* 110*  BUN 6 8  CREATININE 0.75 0.74  CALCIUM 9.2 8.5*  MG  --  2.2   Liver Function Tests:  Recent Labs Lab 10/23/16 1526  AST 21  ALT 26  ALKPHOS 67  BILITOT 0.6  PROT 7.0  ALBUMIN 3.9   No results for input(s): LIPASE, AMYLASE in the last 168 hours. No results for input(s): AMMONIA in the last 168 hours. CBC:  Recent Labs Lab 10/23/16 1526 10/24/16 0615  WBC 10.7* 7.0  NEUTROABS  --  5.6  HGB 13.0 13.4  HCT 37.5 38.6  MCV 89.9 89.6  PLT 235 211   Cardiac Enzymes: No results for input(s): CKTOTAL, CKMB, CKMBINDEX, TROPONINI in the last 168 hours. BNP (last 3 results) No results for input(s): BNP in the last 8760 hours.  ProBNP (last 3 results) No results for input(s): PROBNP in the last 8760 hours.  CBG:  Recent Labs Lab 10/23/16 1702  GLUCAP 111*    No results found for this or any previous visit (from the past 240 hour(s)).   Studies: Ct Head Wo Contrast  Result Date: 10/23/2016 CLINICAL DATA:  Intermittent headache for 1 week with vomiting. EXAM: CT HEAD WITHOUT CONTRAST TECHNIQUE: Contiguous axial images were obtained from the base of the skull through the vertex without intravenous contrast. COMPARISON:  12/07/2004 FINDINGS: Brain: There are multiple apparent hyperattenuating masses along the corticomedullary junctions of both cerebral hemispheres. In the right frontal lobe there is a 19 mm lesion. In the left posteromedial parietal lobe there is an 18 mm lesion. In the right parietal lobe superiorly and medially there is a 2.3 cm lesion. There multiple additional smaller lesions. There is surrounding white matter hypoattenuation consistent with vasogenic edema. There is no midline shift. There is some mass effect reflected by sulcal effacement. The ventricles are  normal in overall size and configuration. There is a questionable small lesion in the right cerebellum. There is no evidence of an infarct. There are no extra-axial masses or abnormal fluid collections. There is no intracranial hemorrhage. Vascular: No hyperdense vessel or unexpected calcification. Skull: Normal. Negative for fracture or focal lesion. Sinuses/Orbits: Globes and orbits unremarkable. Visualized sinuses are essentially clear. Other: None. IMPRESSION: 1. Now multiple relatively hyperattenuating brain masses with associated vasogenic edema consistent with metastatic disease. Followup brain MRI with and without contrast is recommended. Electronically Signed   By: Lajean Manes M.D.   On: 10/23/2016 16:19   Ct Chest W Contrast  Result Date: 10/23/2016 CLINICAL DATA:  Diagnosed with brain mass today. EXAM: CT CHEST, ABDOMEN, AND PELVIS WITH CONTRAST TECHNIQUE: Multidetector CT imaging of the chest, abdomen and pelvis was performed following the standard protocol during bolus administration of intravenous contrast. CONTRAST:  131mL ISOVUE-300 IOPAMIDOL (ISOVUE-300) INJECTION 61% COMPARISON:  None. FINDINGS: CT CHEST FINDINGS Cardiovascular: Heart size is normal. No pericardial effusion. Thoracic aorta is normal in caliber. Mediastinum/Nodes: No mass or enlarged lymph nodes seen within the mediastinum, perihilar or axillary regions. Esophagus appears normal. Thyroid gland appears normal. Trachea and central bronchi are unremarkable. Lungs/Pleura: Scattered pulmonary nodules scattered throughout both lungs, at least 10 on the right and at least 7 on the left. Largest pulmonary nodule is within the right upper lobe measuring 2.2 x 2.2 x 1.7 cm (series 40, image 54). Largest nodule on the left is located within the left upper lobe, inferior segment posteriorly, measuring 2 x 1.6 x 1.5 cm (series 4, image 47). There are also small bilateral effusions with adjacent atelectasis. Musculoskeletal: No acute or  suspicious osseous finding. Superficial soft tissues are unremarkable. CT ABDOMEN PELVIS FINDINGS Hepatobiliary: Status post cholecystectomy. No focal mass or lesion within the liver. No bile duct dilatation. Pancreas: Unremarkable. No pancreatic ductal dilatation or surrounding inflammatory changes. Spleen: Normal in size without focal abnormality. Adrenals/Urinary Tract: Bilateral adrenal masses, measuring approximately 2.4 x 2.2 cm on the left and approximately 2 x 1.4 cm on the right. Both kidneys appear normal without mass, stone or hydronephrosis. No ureteral or bladder calculi identified. Bladder appears normal. Stomach/Bowel: Bowel is normal in caliber. No bowel wall thickening or evidence of bowel wall inflammation seen. Appendix appears normal. Stomach appears normal. Vascular/Lymphatic: No vascular abnormality appreciated. Scattered small lymph nodes noted within the bilateral iliac chain regions and retroperitoneum. No pathologically enlarged lymph nodes appreciated within the abdomen or pelvis. There is an enlarged/morphologically abnormal lymph node within the left inguinal region, with central hypodensity suspicious for necrosis, measuring 3.5 x 2.3 cm. Reproductive: Solid-appearing mass within the left adnexal region measures 3.2 x 2.3 cm, likely contiguous with the left ovary. Additional cystic-appearing mass within the right adnexal region measures 6.6 x 4.2 cm, likely contiguous with the right ovary. Small amount of free fluid within the adjacent cul-de-sac. Other: None Musculoskeletal: No acute or suspicious osseous finding. IMPRESSION: 1. Multiple scattered pulmonary nodules within  each lung, consistent with metastases. Largest nodule is located within the right upper lobe measuring 2.2 x 2.2 x 1.7 cm. 2. Small bilateral pleural effusions. 3. Solid-appearing mass within the left adnexal region measuring 3.2 x 2.3 cm. Additional cystic-appearing mass within the right adnexal region measuring 6.6  x 4.2 cm. 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 4. Enlarged/morphologically abnormal lymph node within the left inguinal region, with central hypodensity suspicious for necrosis, highly suspicious for metastatic lymph node. 5. Bilateral adrenal masses, also suspicious for metastatic disease. Electronically Signed   By: Franki Cabot M.D.   On: 10/23/2016 22:26   Mr Jeri Cos And Wo Contrast  Result Date: 10/23/2016 CLINICAL DATA:  Headache, confusion, difficulty walking beginning last week. Follow-up brain mass. EXAM: MRI HEAD WITHOUT AND WITH CONTRAST TECHNIQUE: Multiplanar, multiecho pulse sequences of the brain and surrounding structures were obtained without and with intravenous contrast. CONTRAST:  37mL MULTIHANCE GADOBENATE DIMEGLUMINE 529 MG/ML IV SOLN COMPARISON:  CT HEAD October 23, 2016 at 1600 hours. FINDINGS: INTRACRANIAL CONTENTS: Greater than 100 supra- and infratentorial parenchymal rounded enhancing masses with reduced diffusion within the larger lesions, their able T1 shortening and faint susceptibility artifact. Associated vasogenic edema. Largest lesions are 2.2 cm bilateral parietal lobes, 2.4 cm RIGHT frontal lobe. Local mass effect without midline shift. No extra-axial mass or abnormal enhancement. Ventricles and sulci are overall normal for patient's age. No abnormal extra-axial fluid collections. VASCULAR: Normal major intracranial vascular flow voids present at skull base. SKULL AND UPPER CERVICAL SPINE: No abnormal sellar expansion. No suspicious calvarial bone marrow signal. Craniocervical junction maintained. SINUSES/ORBITS: Trace mastoid effusions. The included ocular globes and orbital contents are non-suspicious. OTHER: None. IMPRESSION: Numerous supra- and infratentorial metastasis, with imaging characteristics of melanoma or mildly hemorrhagic metastasis. Local mass effect without midline shift. Acute findings discussed with and reconfirmed by Dr.NATHAN  PICKERING on 10/23/2016 at 8:55 p.m. Electronically Signed   By: Elon Alas M.D.   On: 10/23/2016 21:07   Ct Abdomen Pelvis W Contrast  Result Date: 10/23/2016 CLINICAL DATA:  Diagnosed with brain mass today. EXAM: CT CHEST, ABDOMEN, AND PELVIS WITH CONTRAST TECHNIQUE: Multidetector CT imaging of the chest, abdomen and pelvis was performed following the standard protocol during bolus administration of intravenous contrast. CONTRAST:  16mL ISOVUE-300 IOPAMIDOL (ISOVUE-300) INJECTION 61% COMPARISON:  None. FINDINGS: CT CHEST FINDINGS Cardiovascular: Heart size is normal. No pericardial effusion. Thoracic aorta is normal in caliber. Mediastinum/Nodes: No mass or enlarged lymph nodes seen within the mediastinum, perihilar or axillary regions. Esophagus appears normal. Thyroid gland appears normal. Trachea and central bronchi are unremarkable. Lungs/Pleura: Scattered pulmonary nodules scattered throughout both lungs, at least 10 on the right and at least 7 on the left. Largest pulmonary nodule is within the right upper lobe measuring 2.2 x 2.2 x 1.7 cm (series 40, image 54). Largest nodule on the left is located within the left upper lobe, inferior segment posteriorly, measuring 2 x 1.6 x 1.5 cm (series 4, image 47). There are also small bilateral effusions with adjacent atelectasis. Musculoskeletal: No acute or suspicious osseous finding. Superficial soft tissues are unremarkable. CT ABDOMEN PELVIS FINDINGS Hepatobiliary: Status post cholecystectomy. No focal mass or lesion within the liver. No bile duct dilatation. Pancreas: Unremarkable. No pancreatic ductal dilatation or surrounding inflammatory changes. Spleen: Normal in size without focal abnormality. Adrenals/Urinary Tract: Bilateral adrenal masses, measuring approximately 2.4 x 2.2 cm on the left and approximately 2 x 1.4 cm on the right. Both kidneys appear normal  without mass, stone or hydronephrosis. No ureteral or bladder calculi identified.  Bladder appears normal. Stomach/Bowel: Bowel is normal in caliber. No bowel wall thickening or evidence of bowel wall inflammation seen. Appendix appears normal. Stomach appears normal. Vascular/Lymphatic: No vascular abnormality appreciated. Scattered small lymph nodes noted within the bilateral iliac chain regions and retroperitoneum. No pathologically enlarged lymph nodes appreciated within the abdomen or pelvis. There is an enlarged/morphologically abnormal lymph node within the left inguinal region, with central hypodensity suspicious for necrosis, measuring 3.5 x 2.3 cm. Reproductive: Solid-appearing mass within the left adnexal region measures 3.2 x 2.3 cm, likely contiguous with the left ovary. Additional cystic-appearing mass within the right adnexal region measures 6.6 x 4.2 cm, likely contiguous with the right ovary. Small amount of free fluid within the adjacent cul-de-sac. Other: None Musculoskeletal: No acute or suspicious osseous finding. IMPRESSION: 1. Multiple scattered pulmonary nodules within each lung, consistent with metastases. Largest nodule is located within the right upper lobe measuring 2.2 x 2.2 x 1.7 cm. 2. Small bilateral pleural effusions. 3. Solid-appearing mass within the left adnexal region measuring 3.2 x 2.3 cm. Additional cystic-appearing mass within the right adnexal region measuring 6.6 x 4.2 cm. 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 4. Enlarged/morphologically abnormal lymph node within the left inguinal region, with central hypodensity suspicious for necrosis, highly suspicious for metastatic lymph node. 5. Bilateral adrenal masses, also suspicious for metastatic disease. Electronically Signed   By: Franki Cabot M.D.   On: 10/23/2016 22:26    Scheduled Meds: . dexamethasone  4 mg Intravenous Q6H  . sodium chloride flush  3 mL Intravenous Q12H   Continuous Infusions: . 0.9 % NaCl with KCl 20 mEq / L 125 mL/hr at 10/23/16 2316    Principal  Problem:   Brain mass Active Problems:   Hypokalemia   Bradycardia   Tobacco use disorder    Time spent: >35 minutes     Kinnie Feil  [facility] Pager [phone]. If 7PM-7AM, please contact night-coverage at [email or web address], password [facility] 10/24/2016, 8:50 AM  LOS: 1 day

## 2016-10-24 NOTE — Consult Note (Signed)
Reason for Referral: Suspected metastatic malignancy.   HPI: This is a 31 year old woman without any significant comorbid conditions hospitalized on 10/23/2016 after presenting with symptoms of confusion and altered mental status. According to the family, she has been experiencing headaches and confusion and missed work because of it. Based on these findings, she was evaluated urgently and a CT scan of the brain obtained on 10/23/2016 showed multiple hyperattenuating lesion associated with vasogenic edema that is suspicious for malignancy. MRI of the brain was obtained which showed numerous supra and infratentorial masses associated with vasogenic edema without midline shift. These masses are numerous with the largest lesion measuring 2.2 cm in the parietal lobe and a 2.4 cm right frontal lobe. CT scan of the chest, abdomen and pelvis did show evidence of advanced malignancy with multiple scattered pulmonary nodules largest of which was in the right upper lobe measuring 2.2 x 2.2 x 1.7 cm. There is a solid appearing mass was a left adnexal lesion measuring 3.2 x 2.3 cm. There is additional cystic appearing mass in the right adnexa measuring 6.6 x 4.2 cm. Enlarged pelvic adenopathy was also noted. 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  Overnight, she was started on dexamethasone which have helped her symptoms slightly. She is less confused and more appropriate we will she still reporting some headaches. She denied any fevers, chills or sweats. She denied any abdominal pain or discomfort. She denied any change in her bowel habits. She denied any skin rashes or lesions. She denied any history of any excessive sun exposure. The remaining review of system was unremarkable.   Past Medical History:  Diagnosis Date  . Brain mass 10/2016  . Fibromyalgia   . Osteoarthritis   :  Past Surgical History:  Procedure Laterality Date  .  CESAREAN SECTION    . CHOLECYSTECTOMY    :   Current Facility-Administered Medications:  .  0.9 % NaCl with KCl 20 mEq/ L  infusion, , Intravenous, Continuous, Kinnie Feil, MD, Last Rate: 75 mL/hr at 10/24/16 0928 .  dexamethasone (DECADRON) injection 4 mg, 4 mg, Intravenous, Q6H, Reubin Milan, MD, 4 mg at 10/24/16 1119 .  HYDROmorphone (DILAUDID) injection 1 mg, 1 mg, Intravenous, Q4H PRN, Reubin Milan, MD, 1 mg at 10/24/16 9163 .  ondansetron (ZOFRAN) tablet 4 mg, 4 mg, Oral, Q6H PRN **OR** ondansetron (ZOFRAN) injection 4 mg, 4 mg, Intravenous, Q6H PRN, Reubin Milan, MD .  sodium chloride flush (NS) 0.9 % injection 3 mL, 3 mL, Intravenous, Q12H, Reubin Milan, MD, Stopped at 10/24/16 [phone]:  Allergies  Allergen Reactions  . Other Hives    NO "-CILLIN(s)"  . Penicillins Hives    Has patient had a PCN reaction causing immediate rash, facial/tongue/throat swelling, SOB or lightheadedness with hypotension: Yes Has patient had a PCN reaction causing severe rash involving mucus membranes or skin necrosis: No Has patient had a PCN reaction that required hospitalization: No Has patient had a PCN reaction occurring within the last 10 years: No If all of the above answers are "NO", then may proceed with Cephalosporin use.   :  Family History  Problem Relation Age of Onset  . Thyroid cancer Mother   . Cardiomyopathy Father   :  Social History   Social History  . Marital status: Single    Spouse name: N/A  . Number of children: N/A  . Years of  education: N/A   Occupational History  . Not on file.   Social History Main Topics  . Smoking status: Current Every Day Smoker    Packs/day: 0.75    Years: 10.00    Types: Cigarettes  . Smokeless tobacco: Never Used  . Alcohol use Yes     Comment: Socially   . Drug use: No  . Sexual activity: Not on file   Other Topics Concern  . Not on file   Social History Narrative  . No narrative on file   :  Pertinent items are noted in HPI.  Exam: Blood pressure 138/68, pulse (!) 54, temperature 99 F (37.2 C), temperature source Oral, resp. rate 18, SpO2 96 %.  ECOG 1 General appearance: alert and cooperative appeared tired but appropriate in her answers. Head: Normocephalic, without obvious abnormality Throat: No oral ulcers or lesions. Neck: no adenopathy Resp: clear to auscultation bilaterally no rhonchi, wheezes or dullness to percussion. Chest wall: no tenderness Cardio: regular rate and rhythm, S1, S2 normal, no murmur, click, rub or gallop GI: soft, non-tender; bowel sounds normal; no masses,  no organomegaly Extremities: extremities normal, atraumatic, no cyanosis or edema  Skin: No rashes or lesions noted.  Neurological examination: Showed no deficits noted on exam.   Recent Labs  10/23/16 1526 10/24/16 0615  WBC 10.7* 7.0  HGB 13.0 13.4  HCT 37.5 38.6  PLT 235 211    Recent Labs  10/23/16 1526 10/24/16 0615  NA 137 137  K 3.2* 3.5  CL 103 103  CO2 23 23  GLUCOSE 101* 110*  BUN 6 8  CREATININE 0.75 0.74  CALCIUM 9.2 8.5*       Ct Head Wo Contrast  Result Date: 10/23/2016 CLINICAL DATA:  Intermittent headache for 1 week with vomiting. EXAM: CT HEAD WITHOUT CONTRAST TECHNIQUE: Contiguous axial images were obtained from the base of the skull through the vertex without intravenous contrast. COMPARISON:  12/07/2004 FINDINGS: Brain: There are multiple apparent hyperattenuating masses along the corticomedullary junctions of both cerebral hemispheres. In the right frontal lobe there is a 19 mm lesion. In the left posteromedial parietal lobe there is an 18 mm lesion. In the right parietal lobe superiorly and medially there is a 2.3 cm lesion. There multiple additional smaller lesions. There is surrounding white matter hypoattenuation consistent with vasogenic edema. There is no midline shift. There is some mass effect reflected by sulcal effacement. The ventricles  are normal in overall size and configuration. There is a questionable small lesion in the right cerebellum. There is no evidence of an infarct. There are no extra-axial masses or abnormal fluid collections. There is no intracranial hemorrhage. Vascular: No hyperdense vessel or unexpected calcification. Skull: Normal. Negative for fracture or focal lesion. Sinuses/Orbits: Globes and orbits unremarkable. Visualized sinuses are essentially clear. Other: None. IMPRESSION: 1. Now multiple relatively hyperattenuating brain masses with associated vasogenic edema consistent with metastatic disease. Followup brain MRI with and without contrast is recommended. Electronically Signed   By: Lajean Manes M.D.   On: 10/23/2016 16:19   Ct Chest W Contrast  Result Date: 10/23/2016 CLINICAL DATA:  Diagnosed with brain mass today. EXAM: CT CHEST, ABDOMEN, AND PELVIS WITH CONTRAST TECHNIQUE: Multidetector CT imaging of the chest, abdomen and pelvis was performed following the standard protocol during bolus administration of intravenous contrast. CONTRAST:  110m ISOVUE-300 IOPAMIDOL (ISOVUE-300) INJECTION 61% COMPARISON:  None. FINDINGS: CT CHEST FINDINGS Cardiovascular: Heart size is normal. No pericardial effusion. Thoracic aorta is normal in caliber.  Mediastinum/Nodes: No mass or enlarged lymph nodes seen within the mediastinum, perihilar or axillary regions. Esophagus appears normal. Thyroid gland appears normal. Trachea and central bronchi are unremarkable. Lungs/Pleura: Scattered pulmonary nodules scattered throughout both lungs, at least 10 on the right and at least 7 on the left. Largest pulmonary nodule is within the right upper lobe measuring 2.2 x 2.2 x 1.7 cm (series 40, image 54). Largest nodule on the left is located within the left upper lobe, inferior segment posteriorly, measuring 2 x 1.6 x 1.5 cm (series 4, image 47). There are also small bilateral effusions with adjacent atelectasis. Musculoskeletal: No acute or  suspicious osseous finding. Superficial soft tissues are unremarkable. CT ABDOMEN PELVIS FINDINGS Hepatobiliary: Status post cholecystectomy. No focal mass or lesion within the liver. No bile duct dilatation. Pancreas: Unremarkable. No pancreatic ductal dilatation or surrounding inflammatory changes. Spleen: Normal in size without focal abnormality. Adrenals/Urinary Tract: Bilateral adrenal masses, measuring approximately 2.4 x 2.2 cm on the left and approximately 2 x 1.4 cm on the right. Both kidneys appear normal without mass, stone or hydronephrosis. No ureteral or bladder calculi identified. Bladder appears normal. Stomach/Bowel: Bowel is normal in caliber. No bowel wall thickening or evidence of bowel wall inflammation seen. Appendix appears normal. Stomach appears normal. Vascular/Lymphatic: No vascular abnormality appreciated. Scattered small lymph nodes noted within the bilateral iliac chain regions and retroperitoneum. No pathologically enlarged lymph nodes appreciated within the abdomen or pelvis. There is an enlarged/morphologically abnormal lymph node within the left inguinal region, with central hypodensity suspicious for necrosis, measuring 3.5 x 2.3 cm. Reproductive: Solid-appearing mass within the left adnexal region measures 3.2 x 2.3 cm, likely contiguous with the left ovary. Additional cystic-appearing mass within the right adnexal region measures 6.6 x 4.2 cm, likely contiguous with the right ovary. Small amount of free fluid within the adjacent cul-de-sac. Other: None Musculoskeletal: No acute or suspicious osseous finding. IMPRESSION: 1. Multiple scattered pulmonary nodules within each lung, consistent with metastases. Largest nodule is located within the right upper lobe measuring 2.2 x 2.2 x 1.7 cm. 2. Small bilateral pleural effusions. 3. Solid-appearing mass within the left adnexal region measuring 3.2 x 2.3 cm. Additional cystic-appearing mass within the right adnexal region measuring 6.6  x 4.2 cm. 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 4. Enlarged/morphologically abnormal lymph node within the left inguinal region, with central hypodensity suspicious for necrosis, highly suspicious for metastatic lymph node. 5. Bilateral adrenal masses, also suspicious for metastatic disease. Electronically Signed   By: Franki Cabot M.D.   On: 10/23/2016 22:26   Mr Jeri Cos And Wo Contrast  Result Date: 10/23/2016 CLINICAL DATA:  Headache, confusion, difficulty walking beginning last week. Follow-up brain mass. EXAM: MRI HEAD WITHOUT AND WITH CONTRAST TECHNIQUE: Multiplanar, multiecho pulse sequences of the brain and surrounding structures were obtained without and with intravenous contrast. CONTRAST:  17m MULTIHANCE GADOBENATE DIMEGLUMINE 529 MG/ML IV SOLN COMPARISON:  CT HEAD October 23, 2016 at 1600 hours. FINDINGS: INTRACRANIAL CONTENTS: Greater than 100 supra- and infratentorial parenchymal rounded enhancing masses with reduced diffusion within the larger lesions, their able T1 shortening and faint susceptibility artifact. Associated vasogenic edema. Largest lesions are 2.2 cm bilateral parietal lobes, 2.4 cm RIGHT frontal lobe. Local mass effect without midline shift. No extra-axial mass or abnormal enhancement. Ventricles and sulci are overall normal for patient's age. No abnormal extra-axial fluid collections. VASCULAR: Normal major intracranial vascular flow voids present at skull base. SKULL AND UPPER CERVICAL SPINE: No abnormal sellar  expansion. No suspicious calvarial bone marrow signal. Craniocervical junction maintained. SINUSES/ORBITS: Trace mastoid effusions. The included ocular globes and orbital contents are non-suspicious. OTHER: None. IMPRESSION: Numerous supra- and infratentorial metastasis, with imaging characteristics of melanoma or mildly hemorrhagic metastasis. Local mass effect without midline shift. Acute findings discussed with and reconfirmed by Dr.NATHAN  PICKERING on 10/23/2016 at 8:55 p.m. Electronically Signed   By: Elon Alas M.D.   On: 10/23/2016 21:07   Ct Abdomen Pelvis W Contrast  Result Date: 10/23/2016 CLINICAL DATA:  Diagnosed with brain mass today. EXAM: CT CHEST, ABDOMEN, AND PELVIS WITH CONTRAST TECHNIQUE: Multidetector CT imaging of the chest, abdomen and pelvis was performed following the standard protocol during bolus administration of intravenous contrast. CONTRAST:  1103m ISOVUE-300 IOPAMIDOL (ISOVUE-300) INJECTION 61% COMPARISON:  None. FINDINGS: CT CHEST FINDINGS Cardiovascular: Heart size is normal. No pericardial effusion. Thoracic aorta is normal in caliber. Mediastinum/Nodes: No mass or enlarged lymph nodes seen within the mediastinum, perihilar or axillary regions. Esophagus appears normal. Thyroid gland appears normal. Trachea and central bronchi are unremarkable. Lungs/Pleura: Scattered pulmonary nodules scattered throughout both lungs, at least 10 on the right and at least 7 on the left. Largest pulmonary nodule is within the right upper lobe measuring 2.2 x 2.2 x 1.7 cm (series 40, image 54). Largest nodule on the left is located within the left upper lobe, inferior segment posteriorly, measuring 2 x 1.6 x 1.5 cm (series 4, image 47). There are also small bilateral effusions with adjacent atelectasis. Musculoskeletal: No acute or suspicious osseous finding. Superficial soft tissues are unremarkable. CT ABDOMEN PELVIS FINDINGS Hepatobiliary: Status post cholecystectomy. No focal mass or lesion within the liver. No bile duct dilatation. Pancreas: Unremarkable. No pancreatic ductal dilatation or surrounding inflammatory changes. Spleen: Normal in size without focal abnormality. Adrenals/Urinary Tract: Bilateral adrenal masses, measuring approximately 2.4 x 2.2 cm on the left and approximately 2 x 1.4 cm on the right. Both kidneys appear normal without mass, stone or hydronephrosis. No ureteral or bladder calculi identified.  Bladder appears normal. Stomach/Bowel: Bowel is normal in caliber. No bowel wall thickening or evidence of bowel wall inflammation seen. Appendix appears normal. Stomach appears normal. Vascular/Lymphatic: No vascular abnormality appreciated. Scattered small lymph nodes noted within the bilateral iliac chain regions and retroperitoneum. No pathologically enlarged lymph nodes appreciated within the abdomen or pelvis. There is an enlarged/morphologically abnormal lymph node within the left inguinal region, with central hypodensity suspicious for necrosis, measuring 3.5 x 2.3 cm. Reproductive: Solid-appearing mass within the left adnexal region measures 3.2 x 2.3 cm, likely contiguous with the left ovary. Additional cystic-appearing mass within the right adnexal region measures 6.6 x 4.2 cm, likely contiguous with the right ovary. Small amount of free fluid within the adjacent cul-de-sac. Other: None Musculoskeletal: No acute or suspicious osseous finding. IMPRESSION: 1. Multiple scattered pulmonary nodules within each lung, consistent with metastases. Largest nodule is located within the right upper lobe measuring 2.2 x 2.2 x 1.7 cm. 2. Small bilateral pleural effusions. 3. Solid-appearing mass within the left adnexal region measuring 3.2 x 2.3 cm. Additional cystic-appearing mass within the right adnexal region measuring 6.6 x 4.2 cm. 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 4. Enlarged/morphologically abnormal lymph node within the left inguinal region, with central hypodensity suspicious for necrosis, highly suspicious for metastatic lymph node. 5. Bilateral adrenal masses, also suspicious for metastatic disease. Electronically Signed   By: SFranki CabotM.D.   On: 10/23/2016 22:26    Assessment and Plan:  31 year old woman with the following issues:  1. Suspicious for advanced malignancy. She has multiple masses and lesions noted in both ovaries, adrenal glands, lymphadenopathy,  pulmonary lesions as well as brain lesions. This presentation is suspicious for malignancy and benign etiologies are considered extremely unlikely.  The differential diagnosis for such advanced malignancy given her age would include metastatic melanoma, malignancy of GYN tract, thyroid cancer, lymphoma among others. Infectious causes around the differential but considered less likely.  The next step is to obtain tissue sampling as soon as possible. Once the pathology is available and confirm malignancy, further treatment strategy will be discussed at that time. It is difficult to formulate a treatment plan without knowing the exact nature of her malignancy and whether this is malignancy at all.  I discussed these findings with patient and her parents today. And further treatment and prognosis data will be depending on the exact pathology. If we are dealing with advanced melanoma, BRAF testing will be critical to determine the top of melanoma she has which will also dictate treatment.  Her prognosis is also driven by the nature of this malignancy but in all likelihood we're dealing with poor prognosis given the advanced nature of her disease.  2. Brain metastasis: I agree with dexamethasone treatment as you are doing. Once pathology is confirmed that we are dealing with malignancy, probably radiation therapy to the brain will be the next step.  3. Disposition: We will arrange follow-up at the [facility] upon discharge once she is medically stable.

## 2016-10-24 NOTE — Sedation Documentation (Signed)
Patient is resting comfortably. 

## 2016-10-24 NOTE — Procedures (Signed)
L inguinal LN Bx 18 g core itmes three No comp/EBL

## 2016-10-24 NOTE — Sedation Documentation (Signed)
Patient denies pain and is resting comfortably.  

## 2016-10-24 NOTE — Progress Notes (Signed)
NURSING PROGRESS NOTE  Julia Johanson McGlynnMRN: [id number] Admission Data: 10/23/2016 at 10:30PM Attending Provider: Reubin Milan, MD PCP: No PCP Per Patient Code status: Full  Allergies:  Allergies  Allergen Reactions  . Other Hives    NO "-CILLIN(s)"  . Penicillins Hives    Has patient had a PCN reaction causing immediate rash, facial/tongue/throat swelling, SOB or lightheadedness with hypotension: Yes Has patient had a PCN reaction causing severe rash involving mucus membranes or skin necrosis: No Has patient had a PCN reaction that required hospitalization: No Has patient had a PCN reaction occurring within the last 10 years: No If all of the above answers are "NO", then may proceed with Cephalosporin use.    Past Medical History:  Past Medical History:  Diagnosis Date  . Fibromyalgia   . Osteoarthritis    Past Surgical History:  Past Surgical History:  Procedure Laterality Date  . CESAREAN SECTION    . CHOLECYSTECTOMY     Julia Luna is a 31 y.o. female patient, arrived to floor in room [phone] via stretcher, transferred from ED. Patient alert and oriented X 4 with delayed responses. No acute distress noted. Denies pain.   Vital signs: Oral temperature 98.2 F (36.8 C), Blood pressure 120/72, Pulse 61, RR 17, SpO2 94 % on room air.   Cardiac monitoring: Telemetry box 5W #25 in place. Second verified by Tammy, NT  IV access: Right Hand and AC-saline locked; condition patent and no redness.  Skin: intact, no pressure ulcer noted in sacral area. Bruising noted on arms and legs. Second verified by Juanna Cao, RN  Patient's ID armband verified with patient/ family, and in place. Information packet given to patient/ family. Fall risk assessed, SR up X2, patient/ family able to verbalize understanding of risks associated with falls and to call nurse or staff to assist before getting out of bed. Patient/ family oriented to room and equipment. Call bell within  reach.

## 2016-10-25 ENCOUNTER — Inpatient Hospital Stay (HOSPITAL_COMMUNITY): Payer: Self-pay

## 2016-10-25 DIAGNOSIS — R4189 Other symptoms and signs involving cognitive functions and awareness: Secondary | ICD-10-CM | POA: Diagnosis present

## 2016-10-25 DIAGNOSIS — R404 Transient alteration of awareness: Secondary | ICD-10-CM | POA: Diagnosis present

## 2016-10-25 DIAGNOSIS — C7931 Secondary malignant neoplasm of brain: Secondary | ICD-10-CM | POA: Diagnosis present

## 2016-10-25 DIAGNOSIS — C801 Malignant (primary) neoplasm, unspecified: Secondary | ICD-10-CM

## 2016-10-25 DIAGNOSIS — G9340 Encephalopathy, unspecified: Secondary | ICD-10-CM | POA: Diagnosis present

## 2016-10-25 DIAGNOSIS — R519 Headache, unspecified: Secondary | ICD-10-CM | POA: Diagnosis present

## 2016-10-25 DIAGNOSIS — M797 Fibromyalgia: Secondary | ICD-10-CM | POA: Diagnosis present

## 2016-10-25 DIAGNOSIS — R51 Headache: Secondary | ICD-10-CM

## 2016-10-25 LAB — BASIC METABOLIC PANEL
Anion gap: 11 (ref 5–15)
BUN: 10 mg/dL (ref 6–20)
CALCIUM: 9.1 mg/dL (ref 8.9–10.3)
CO2: 22 mmol/L (ref 22–32)
Chloride: 103 mmol/L (ref 101–111)
Creatinine, Ser: 0.69 mg/dL (ref 0.44–1.00)
GFR calc Af Amer: >60 mL/min (ref >60)
GLUCOSE: 112 mg/dL — AB (ref 65–99)
Potassium: 4 mmol/L (ref 3.5–5.1)
Sodium: 136 mmol/L (ref 135–145)

## 2016-10-25 LAB — BLOOD GAS, ARTERIAL
ACID-BASE DEFICIT: 0.7 mmol/L (ref 0.0–2.0)
BICARBONATE: 23.2 mmol/L (ref 20.0–28.0)
Drawn by: 441661
FIO2: 0.21
O2 Saturation: 95.8 %
PH ART: 7.417 (ref 7.350–7.450)
Patient temperature: 98.6
pCO2 arterial: 36.7 mmHg (ref 32.0–48.0)
pO2, Arterial: 83.1 mmHg (ref 83.0–108.0)

## 2016-10-25 LAB — SEDIMENTATION RATE: SED RATE: 9 mm/h (ref 0–22)

## 2016-10-25 LAB — CBC WITH DIFFERENTIAL/PLATELET
Basophils Absolute: 0 10*3/uL (ref 0.0–0.1)
Basophils Relative: 0 %
EOS PCT: 0 %
Eosinophils Absolute: 0 10*3/uL (ref 0.0–0.7)
HCT: 40.3 % (ref 36.0–46.0)
Hemoglobin: 13.9 g/dL (ref 12.0–15.0)
LYMPHS ABS: 1.8 10*3/uL (ref 0.7–4.0)
LYMPHS PCT: 16 %
MCH: 31.2 pg (ref 26.0–34.0)
MCHC: 34.5 g/dL (ref 30.0–36.0)
MCV: 90.6 fL (ref 78.0–100.0)
MONO ABS: 0.5 10*3/uL (ref 0.1–1.0)
Monocytes Relative: 4 %
Neutro Abs: 9.2 10*3/uL — ABNORMAL HIGH (ref 1.7–7.7)
Neutrophils Relative %: 80 %
PLATELETS: 270 10*3/uL (ref 150–400)
RBC: 4.45 MIL/uL (ref 3.87–5.11)
RDW: 13.2 % (ref 11.5–15.5)
WBC: 11.5 10*3/uL — ABNORMAL HIGH (ref 4.0–10.5)

## 2016-10-25 LAB — C-REACTIVE PROTEIN: CRP: <0.8 mg/dL (ref <1.0)

## 2016-10-25 LAB — TSH: TSH: 2.752 u[IU]/mL (ref 0.350–4.500)

## 2016-10-25 LAB — MRSA PCR SCREENING: MRSA by PCR: NEGATIVE

## 2016-10-25 LAB — CK: Total CK: 268 U/L — ABNORMAL HIGH (ref 38–234)

## 2016-10-25 LAB — T4, FREE: Free T4: 1.03 ng/dL (ref 0.61–1.12)

## 2016-10-25 LAB — VITAMIN B12: VITAMIN B 12: 539 pg/mL (ref 180–914)

## 2016-10-25 LAB — AMMONIA: AMMONIA: 60 umol/L — AB (ref 9–35)

## 2016-10-25 LAB — LACTIC ACID, PLASMA: LACTIC ACID, VENOUS: 1.3 mmol/L (ref 0.5–1.9)

## 2016-10-25 LAB — GLUCOSE, CAPILLARY: Glucose-Capillary: 97 mg/dL (ref 65–99)

## 2016-10-25 MED ORDER — METHOCARBAMOL 500 MG PO TABS
500.0000 mg | ORAL_TABLET | Freq: Two times a day (BID) | ORAL | Status: DC
  Administered 2016-10-25 – 2016-10-27 (×5): 500 mg via ORAL
  Filled 2016-10-25 (×5): qty 1

## 2016-10-25 MED ORDER — DULOXETINE HCL 30 MG PO CPEP
30.0000 mg | ORAL_CAPSULE | Freq: Every day | ORAL | Status: DC
  Administered 2016-10-25 – 2016-10-28 (×4): 30 mg via ORAL
  Filled 2016-10-25 (×4): qty 1

## 2016-10-25 MED ORDER — OXYCODONE-ACETAMINOPHEN 5-325 MG PO TABS
1.0000 | ORAL_TABLET | ORAL | Status: DC | PRN
  Administered 2016-10-25 – 2016-10-26 (×6): 1 via ORAL
  Filled 2016-10-25 (×5): qty 1

## 2016-10-25 MED ORDER — MORPHINE SULFATE (PF) 2 MG/ML IV SOLN
2.0000 mg | INTRAVENOUS | Status: DC | PRN
  Administered 2016-10-25 (×2): 2 mg via INTRAVENOUS
  Administered 2016-10-25: 4 mg via INTRAVENOUS
  Administered 2016-10-26: 0.5 mg via INTRAVENOUS
  Administered 2016-10-26: 2 mg via INTRAVENOUS
  Administered 2016-10-26: 4 mg via INTRAVENOUS
  Administered 2016-10-27: 2 mg via INTRAVENOUS
  Filled 2016-10-25: qty 2
  Filled 2016-10-25 (×3): qty 1
  Filled 2016-10-25: qty 2
  Filled 2016-10-25 (×2): qty 1

## 2016-10-25 MED ORDER — ACETAMINOPHEN 325 MG PO TABS: 650.0000 mg | ORAL_TABLET | Freq: Four times a day (QID) | ORAL | Status: DC | PRN

## 2016-10-25 MED ORDER — LACTULOSE 10 GM/15ML PO SOLN
20.0000 g | Freq: Two times a day (BID) | ORAL | Status: DC
  Administered 2016-10-25 – 2016-10-27 (×6): 20 g via ORAL
  Filled 2016-10-25 (×6): qty 30

## 2016-10-25 MED ORDER — BUTALBITAL-APAP-CAFFEINE 50-325-40 MG PO TABS
1.0000 | ORAL_TABLET | ORAL | Status: DC | PRN
  Administered 2016-10-25: 1 via ORAL
  Filled 2016-10-25: qty 1

## 2016-10-25 MED ORDER — POLYETHYLENE GLYCOL 3350 17 G PO PACK
17.0000 g | PACK | Freq: Every day | ORAL | Status: DC
  Administered 2016-10-26: 17 g via ORAL
  Filled 2016-10-25: qty 1

## 2016-10-25 NOTE — Progress Notes (Signed)
Report given to Barbara, RN

## 2016-10-25 NOTE — Progress Notes (Signed)
Chaplain responded to Code Blue for this patient.  Room was full of medical team working with patient.  Father was present in the room.  Chaplain provided emotional support for father.  Patient is stable.  Hassell Done, Chaplain Residemt

## 2016-10-25 NOTE — Progress Notes (Signed)
EEG Completed; Results Pending  
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
## 2016-10-25 NOTE — Progress Notes (Signed)
Patient and family wanted to have private admission with a password of "Aaria Lelan Pons"   Family only wants family at bedside and no visitors

## 2016-10-25 NOTE — Progress Notes (Signed)
Attempt report. Secretary stated RN needs to be assigned and will call back

## 2016-10-25 NOTE — Progress Notes (Signed)
RRT AT BEDSIDE. ABG COLLECTED PER MD VERBAL ORDER

## 2016-10-25 NOTE — Progress Notes (Signed)
[facility] Progress Note  Patient: Julia Luna PJA:[id number]   PCP: No PCP Per Patient DOB: 1986-03-01   DOA: 10/23/2016   DOS: 10/25/2016   Date of Service: the patient was seen and examined on 10/25/2016  Brief hospital course: Pt. with PMH of Fibromyalgia, active smoker; admitted on 10/23/2016, with complaint of confusion and headache, was found to have multiple metastatic lesions involving brain, right upper lobe of lung, ovary, adrenal. Patient underwent biopsy of Left inguinal node on 10/24/2016. Oncology consulted. On the night of 10/24/2016 patient had an episode of unresponsiveness etiology of which is unclear. Complains about generalized body ache. Currently further plan is supportive management and await the results of the biopsy.  Assessment and Plan: 1. 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 Acute encephalopathy, headache. Patient presented with complaints of headache as well as confusion. Further workup involved a CT head which showed multiple brain lesions with vasogenic edema without any midline shift. MRI brain with and without contrast verified the finding. CT chest abdomen and pelvis shows multiple pulmonary nodule, bilateral ovarian mass, left inguinal lymphadenopathy, bilateral adrenal mass. Patient underwent biopsy of the left inguinal node on 10/24/2016. Oncology consulted. On IV Decadron for CNS lesions. We will monitor biopsy results which would likely be available on Tuesday at the earliest.  2. Acute encephalopathy. Fibromyalgia Unresponsive episode.  Patient had an episode of unresponsiveness on 10/24/2016, she did not lose pulse there was no abnormality of the telemetry. CODE BLUE was called despite this. No seizures, never lost consciousness or CODE BLUE note. Her workup at that time was unremarkable. ABG was unremarkable as well. At the time of my evaluation the patient remains stable, but the nursing staff  has requested to transfer the patient to stepdown. Orders are placed. We'll monitor the patient closely to identify the potential etiology of patient's presentation. Mild elevation of ammonia, I would give the patient lactulose for today. Mild elevation of the CK likely secondary to immobilization. Normal ESR normal CRP normal lactic acid level essentially rule out any evidence of infection at present or vasculitis. TSH and free T4 also normal. Check blood cultures.  3. Autonomic dysfunction. Hot flushes, sinus bradycardia Supportive management for the treatment. Per family patient has been having issues with maintaining her body temperature and there will be episodes during which patient will have hot flushes. Reportedly the temperature taken at the end of the event was normal. There was also evidence of bradycardia on telemetry with 1 event documented as type II heart block. Patient remaining symptomatic at the time of that evaluation We'll check EKG. Check EEG. Close monitoring in the step down unit at present.  4. Fibromyalgia. Patient does have reported history of fibromyalgia currently complaining of generalized body pain. Requiring high dose of IV Dilaudid and Percocet. At present I would start the patient on Cymbalta 30 mg starting dose and in one recommend to increase to 60 mg daily. Cutting back down on the Dilaudid given the patient's unresponsive episode yesterday. Adding muscle relaxant.  5. Headache. associated with malignancy. On Decadron scheduled. Also adding Tylenol and Fioricet.  Bowel regimen: last BM schedule MiraLAX, lactulose Diet: Regular diet DVT Prophylaxis: SCD  Advance goals of care discussion: Full code  Family Communication: family was present at bedside, at the time of interview. The pt provided permission to discuss medical plan with the family. Opportunity was given to ask question and all questions were answered satisfactorily.   Disposition:  Transferred to step down  unit based on nursing concern for frequent monitoring. Discharge to home. Expected discharge date: 10/28/2016, stabilization of neurological symptoms  Consultants: Oncology Procedures: EEG, inguinal biopsy  Antibiotics: Anti-infectives    None        Subjective: Denies any acute complaint, continues to have the headache.  Objective: Physical Exam: Vitals:   2016/11/01 1547 November 01, 2016 2100 10/25/16 0015 10/25/16 0239  BP: 135/69 (!) 152/87 136/89 101/65  Pulse: 72 62 61 82  Resp:  _0 Temp: 98.9 F (37.2 C) 98.3 F (36.8 C)  97.7 F (36.5 C)  TempSrc: Oral Oral  Oral  SpO2: 97% 95% 100% 96%    Intake/Output Summary (Last 24 hours) at 10/25/16 1323 Last data filed at 10/25/16 1253  Gross per 24 hour  Intake             2155 ml  Output              300 ml  Net             1855 ml   There were no vitals filed for this visit.  General: Alert, Awake and Oriented to Time, Place and Person. Appear in mild distress, affect appropriate Eyes: PERRL, Conjunctiva normal ENT: Oral Mucosa clear moist. Neck: no JVD, no Abnormal Mass Or lumps Cardiovascular: S1 and S2 Present, no Murmur, Respiratory: Bilateral Air entry equal and Decreased, no use of accessory muscle, Clear to Auscultation, no Crackles, no wheezes Abdomen: Bowel Sound present, Soft and no tenderness Skin: no redness, no Rash, no induration Extremities: no Pedal edema, no calf tenderness Neurologic: Grossly no focal neuro deficit. Bilaterally Equal motor strength  Data Reviewed: CBC:  Recent Labs Lab 10/23/16 1526 01-Nov-2016 0615 10/25/16 0240  WBC 10.7* 7.0 11.5*  NEUTROABS  --  5.6 9.2*  HGB 13.0 13.4 13.9  HCT 37.5 38.6 40.3  MCV 89.9 89.6 90.6  PLT 235 211 109   Basic Metabolic Panel:  Recent Labs Lab 10/23/16 1526 11/01/2016 0615 10/25/16 0240  NA 137 137 136  K 3.2* 3.5 4.0  CL 103 103 103  CO2 _1 GLUCOSE 101* 110* 112*  BUN _2 CREATININE 0.75 0.74  0.69  CALCIUM 9.2 8.5* 9.1  MG  --  2.2  --     Liver Function Tests:  Recent Labs Lab 10/23/16 1526  AST 21  ALT 26  ALKPHOS 67  BILITOT 0.6  PROT 7.0  ALBUMIN 3.9   No results for input(s): LIPASE, AMYLASE in the last 168 hours.  Recent Labs Lab 10/25/16 1114  AMMONIA 60*   Coagulation Profile:  Recent Labs Lab 11-01-16 0928  INR 1.15   Cardiac Enzymes:  Recent Labs Lab 10/25/16 1114  CKTOTAL 268*   BNP (last 3 results) No results for input(s): PROBNP in the last 8760 hours.  CBG:  Recent Labs Lab 10/23/16 1702 10/25/16 0238  GLUCAP 111* 97    Studies: US Biopsy  Result Date: 11-01-2016 INDICATION: Left inguinal adenopathy EXAM: ULTRASOUND-GUIDED BIOPSY LEFT INGUINAL LYMPH NODE.  CORE. MEDICATIONS: None. ANESTHESIA/SEDATION: Fentanyl 25 mcg IV; Versed 1 mg IV Moderate Sedation Time:  8 minutes The patient was continuously monitored during the procedure by the interventional radiology nurse under my direct supervision. FLUOROSCOPY TIME:  Fluoroscopy Time:  minutes  seconds ( mGy). COMPLICATIONS: None immediate. PROCEDURE: Informed written consent was obtained from the patient after a thorough discussion of the procedural risks, benefits and alternatives. All questions were addressed. Maximal Sterile  Barrier Technique was utilized including caps, mask, sterile gowns, sterile gloves, sterile drape, hand hygiene and skin antiseptic. A timeout was performed prior to the initiation of the procedure. The left inguinal region was prepped with Chloraprep in a sterile fashion, and a sterile drape was applied covering the operative field. A sterile gown and sterile gloves were used for the procedure. Under sonographic guidance, 3 18 gauge core biopsies were obtained. Final imaging was performed. Patient tolerated the procedure well without complication. Vital sign monitoring by nursing staff during the procedure will continue as patient is in the special procedures unit  for post procedure observation. FINDINGS: The images document guide needle placement within the left inguinal lymph node. Post biopsy images demonstrate no hemorrhage. IMPRESSION: Successful ultrasound-guided core biopsy of an enlarged left inguinal lymph node. Electronically Signed   By: Marybelle Killings M.D.   On: 10/24/2016 15:42     Scheduled Meds: . dexamethasone  4 mg Intravenous Q6H  . DULoxetine  30 mg Oral Daily  . lactulose  20 g Oral BID  . methocarbamol  500 mg Oral BID  . polyethylene glycol  17 g Oral Daily  . sodium chloride flush  3 mL Intravenous Q12H   Continuous Infusions: . 0.9 % NaCl with KCl 20 mEq / L 75 mL/hr at 10/25/16 0341   PRN Meds: acetaminophen, butalbital-acetaminophen-caffeine, morphine injection, ondansetron **OR** ondansetron (ZOFRAN) IV, oxyCODONE-acetaminophen  Time spent: 30 minutes  Author: Berle Mull, MD [facility] Pager: [phone] 10/25/2016 1:23 PM  If 7PM-7AM, please contact night-coverage at [email or web address], password [facility]

## 2016-10-25 NOTE — Progress Notes (Signed)
Pt father call for pain medicine. Upon entering pt room pt positioned prone in bed. When attempted to turn pt to administer pain med, pt found to be unresponsive. Pt had pulse and O2 sat was WDL. Residents and rapid response team at the bedside. VS stable, pt back to her baseline.  Primary doctor notified, will assess pt. Will continue to monitor.

## 2016-10-25 NOTE — Procedures (Signed)
EEG Report  Clinical History:  Altered mental status with brain mets  EEG description:    Background frequency is 6 Hz with intermittent higher amplitude slower frequencies in the low theta and delta range.    No epileptiform discharges or electrographic seizures.    No sleep was recorded.  Impression:  Moderate to severe generalized slowing of brain activity.  This is non-specific but may be related to toxic, metabolic, infectious, hypoxic, or other diffuse structural process.    Rogue Jury, MD

## 2016-10-26 ENCOUNTER — Inpatient Hospital Stay (HOSPITAL_COMMUNITY): Payer: Self-pay

## 2016-10-26 LAB — COMPREHENSIVE METABOLIC PANEL
ALBUMIN: 3.4 g/dL — AB (ref 3.5–5.0)
ALK PHOS: 60 U/L (ref 38–126)
ALT: 58 U/L — AB (ref 14–54)
AST: 33 U/L (ref 15–41)
Anion gap: 9 (ref 5–15)
BILIRUBIN TOTAL: 0.6 mg/dL (ref 0.3–1.2)
BUN: 9 mg/dL (ref 6–20)
CO2: 23 mmol/L (ref 22–32)
CREATININE: 0.75 mg/dL (ref 0.44–1.00)
Calcium: 8.7 mg/dL — ABNORMAL LOW (ref 8.9–10.3)
Chloride: 103 mmol/L (ref 101–111)
GFR calc Af Amer: >60 mL/min (ref >60)
GFR calc non Af Amer: >60 mL/min (ref >60)
GLUCOSE: 108 mg/dL — AB (ref 65–99)
POTASSIUM: 4.1 mmol/L (ref 3.5–5.1)
Sodium: 135 mmol/L (ref 135–145)
TOTAL PROTEIN: 6 g/dL — AB (ref 6.5–8.1)

## 2016-10-26 LAB — CBC WITH DIFFERENTIAL/PLATELET
BASOS ABS: 0 10*3/uL (ref 0.0–0.1)
BASOS PCT: 0 %
Eosinophils Absolute: 0 10*3/uL (ref 0.0–0.7)
Eosinophils Relative: 0 %
HEMATOCRIT: 41.2 % (ref 36.0–46.0)
Hemoglobin: 14.3 g/dL (ref 12.0–15.0)
LYMPHS PCT: 9 %
Lymphs Abs: 1.2 10*3/uL (ref 0.7–4.0)
MCH: 31.5 pg (ref 26.0–34.0)
MCHC: 34.7 g/dL (ref 30.0–36.0)
MCV: 90.7 fL (ref 78.0–100.0)
Monocytes Absolute: 0.5 10*3/uL (ref 0.1–1.0)
Monocytes Relative: 4 %
NEUTROS ABS: 11.1 10*3/uL — AB (ref 1.7–7.7)
NEUTROS PCT: 87 %
Platelets: 213 10*3/uL (ref 150–400)
RBC: 4.54 MIL/uL (ref 3.87–5.11)
RDW: 13.1 % (ref 11.5–15.5)
WBC: 12.7 10*3/uL — AB (ref 4.0–10.5)

## 2016-10-26 LAB — MAGNESIUM: Magnesium: 1.6 mg/dL — ABNORMAL LOW (ref 1.7–2.4)

## 2016-10-26 MED ORDER — POLYETHYLENE GLYCOL 3350 17 G PO PACK
17.0000 g | PACK | Freq: Two times a day (BID) | ORAL | Status: DC
  Administered 2016-10-27 (×2): 17 g via ORAL
  Filled 2016-10-26 (×2): qty 1

## 2016-10-26 MED ORDER — DEXAMETHASONE SODIUM PHOSPHATE 4 MG/ML IJ SOLN
4.0000 mg | Freq: Four times a day (QID) | INTRAMUSCULAR | Status: DC
  Administered 2016-10-26 – 2016-10-28 (×10): 4 mg via INTRAVENOUS
  Filled 2016-10-26 (×12): qty 1

## 2016-10-26 MED ORDER — MAGNESIUM SULFATE 2 GM/50ML IV SOLN
2.0000 g | Freq: Once | INTRAVENOUS | Status: AC
  Administered 2016-10-26: 2 g via INTRAVENOUS
  Filled 2016-10-26: qty 50

## 2016-10-26 NOTE — Progress Notes (Signed)
[facility] Progress Note  Patient: Julia Luna HKV:[id number]   PCP: No PCP Per Patient DOB: [birth year]   DOA: 10/23/2016   DOS: 10/26/2016   Date of Service: the patient was seen and examined on 10/26/2016  Brief hospital course: Pt. with PMH of Fibromyalgia, active smoker; admitted on 10/23/2016, with complaint of confusion and headache, was found to have multiple metastatic lesions involving brain, right upper lobe of lung, ovary, adrenal. Patient underwent biopsy of Left inguinal node on 10/24/2016. Oncology consulted. On the night of 10/24/2016 patient had an episode of unresponsiveness etiology of which is unclear. Complains about generalized body ache. Currently further plan is supportive management and await the results of the biopsy.  Assessment and Plan: 1. 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 Acute encephalopathy, headache. Patient presented with complaints of headache as well as confusion. Further workup involved a CT head which showed multiple brain lesions with vasogenic edema without any midline shift. MRI brain with and without contrast verified the finding. CT chest abdomen and pelvis shows multiple pulmonary nodule, bilateral ovarian mass, left inguinal lymphadenopathy, bilateral adrenal mass. Patient underwent biopsy of the left inguinal node on 10/24/2016. Oncology consulted. On IV Decadron for CNS lesions. We will monitor biopsy results which would likely be available on Tuesday at the earliest.  2. Acute encephalopathy. Fibromyalgia Unresponsive episode. Patient had an episode of unresponsiveness on 10/24/2016, she did not lose pulse there was no abnormality of the telemetry. CODE BLUE was called despite this. No seizures, never lost consciousness per CODE BLUE note. Her workup at that time was unremarkable. ABG was unremarkable as well. At the time of my evaluation the patient remains stable, but the nursing staff  has requested to transfer the patient to stepdown. Orders are placed. Mild elevation of ammonia, I would give the patient lactulose. Mild elevation of the CK likely secondary to immobilization. Normal ESR normal CRP normal lactic acid level essentially rule out any evidence of infection at present or vasculitis. TSH and free T4 also normal. Negative blood cultures.  3. Autonomic dysfunction. Hot flushes, sinus bradycardia Supportive management for the treatment. Per family patient has been having issues with maintaining her body temperature and there will be episodes during which patient will have hot flushes. Reportedly the temperature taken at the end of the event was normal. Requested family to notify RN when this events happen. There was also evidence of bradycardia on telemetry with 1 event documented as type II heart block. Unremarkable EKG. Metabolic encephalopathy per EEG. Close monitoring in the step down unit at present.  4. Fibromyalgia Patient does have reported history of fibromyalgia currently complaining of generalized body pain. Requiring high dose of IV Dilaudid and Percocet. At present I would start the patient on Cymbalta 30 mg starting dose and in one recommend to increase to 60 mg daily. On morphine IV that I would prefer to be used less, family agrees. Continue muscle relaxant.  5. Headache. Resolved associated with malignancy. On Decadron scheduled. Also adding Tylenol and Fioricet.  Bowel regimen: last BM 10/25/2016 Diet: Regular diet DVT Prophylaxis: SCD  Advance goals of care discussion: Full code  Family Communication: family was present at bedside, at the time of interview. The pt provided permission to discuss medical plan with the family. Opportunity was given to ask question and all questions were answered satisfactorily.   Disposition: Transferred to step down unit based on nursing concern for frequent monitoring. Discharge to home. Expected  discharge date: 10/28/2016,  stabilization of neurological symptoms  Consultants: Oncology Procedures: EEG, inguinal biopsy  Antibiotics: Anti-infectives    None      Subjective: passing gas, has some hot flush symptoms last night, episode of vomiting this AM. No episode of confusion or unresponsiveness.   Objective: Physical Exam: Vitals:   10/26/16 0700 10/26/16 0912 10/26/16 1015 10/26/16 1100  BP:  131/87  126/87  Pulse:  63 63 63  Resp:  '12 11 11  '$ Temp: 97.9 F (36.6 C)  98.2 F (36.8 C)   TempSrc: Oral  Oral   SpO2:  96% 93% 94%  Weight:      Height:        Intake/Output Summary (Last 24 hours) at 10/26/16 1333 Last data filed at 10/26/16 1200  Gross per 24 hour  Intake           1552.5 ml  Output             1525 ml  Net             27.5 ml   Filed Weights   10/25/16 1635  Weight: 97.2 kg (214 lb 4.6 oz)    General: Alert, Awake and Oriented to Time, Place and Person. Appear in mild distress, affect appropriate Eyes: PERRL, Conjunctiva normal ENT: Oral Mucosa clear moist. Neck: no JVD, no Abnormal Mass Or lumps Cardiovascular: S1 and S2 Present, no Murmur, Respiratory: Bilateral Air entry equal and Decreased, no use of accessory muscle, Clear to Auscultation, no Crackles, no wheezes Abdomen: Bowel Sound present, Soft and no tenderness Skin: no redness, no Rash, no induration Extremities: no Pedal edema, no calf tenderness Neurologic: Grossly no focal neuro deficit. Bilaterally Equal motor strength  Data Reviewed: CBC:  Recent Labs Lab 10/23/16 1526 10/24/16 0615 10/25/16 0240 10/26/16 0930  WBC 10.7* 7.0 11.5* 12.7*  NEUTROABS  --  5.6 9.2* 11.1*  HGB 13.0 13.4 13.9 14.3  HCT 37.5 38.6 40.3 41.2  MCV 89.9 89.6 90.6 90.7  PLT 235 211 270 294   Basic Metabolic Panel:  Recent Labs Lab 10/23/16 1526 10/24/16 0615 10/25/16 0240 10/26/16 0930  NA 137 137 136 135  K 3.2* 3.5 4.0 4.1  CL 103 103 103 103  CO2 '23 23 22 23  '$ GLUCOSE 101* 110*  112* 108*  BUN '6 8 10 9  '$ CREATININE 0.75 0.74 0.69 0.75  CALCIUM 9.2 8.5* 9.1 8.7*  MG  --  2.2  --  1.6*    Liver Function Tests:  Recent Labs Lab 10/23/16 1526 10/26/16 0930  AST 21 33  ALT 26 58*  ALKPHOS 67 60  BILITOT 0.6 0.6  PROT 7.0 6.0*  ALBUMIN 3.9 3.4*   No results for input(s): LIPASE, AMYLASE in the last 168 hours.  Recent Labs Lab 10/25/16 1114  AMMONIA 60*   Coagulation Profile:  Recent Labs Lab 10/24/16 0928  INR 1.15   Cardiac Enzymes:  Recent Labs Lab 10/25/16 1114  CKTOTAL 268*   BNP (last 3 results) No results for input(s): PROBNP in the last 8760 hours.  CBG:  Recent Labs Lab 10/23/16 1702 10/25/16 0238  GLUCAP 111* 97    Studies: Dg Abd Portable 1v  Result Date: 10/26/2016 CLINICAL DATA:  Constipation, nausea, vomiting EXAM: PORTABLE ABDOMEN - 1 VIEW COMPARISON:  None. FINDINGS: There is gaseous distention of small bowel and colon. No radio-opaque calculi or other significant radiographic abnormality are seen. IMPRESSION: Gaseous distension of small bowel colon. No definite bowel obstruction. Electronically Signed  By: Kathreen Devoid   On: 10/26/2016 11:52     Scheduled Meds: . dexamethasone  4 mg Intravenous Q6H  . DULoxetine  30 mg Oral Daily  . lactulose  20 g Oral BID  . magnesium sulfate 1 - 4 g bolus IVPB  2 g Intravenous Once  . methocarbamol  500 mg Oral BID  . polyethylene glycol  17 g Oral BID  . sodium chloride flush  3 mL Intravenous Q12H   Continuous Infusions: . 0.9 % NaCl with KCl 20 mEq / L 75 mL/hr at 10/26/16 0450   PRN Meds: acetaminophen, butalbital-acetaminophen-caffeine, morphine injection, ondansetron **OR** ondansetron (ZOFRAN) IV, oxyCODONE-acetaminophen  Time spent: 30 minutes  Author: Berle Mull, MD [facility] Pager: [phone] 10/26/2016 1:33 PM  If 7PM-7AM, please contact night-coverage at [email or web address], password [facility]

## 2016-10-27 LAB — ANTINUCLEAR ANTIBODIES, IFA: ANA Ab, IFA: NEGATIVE

## 2016-10-27 MED ORDER — MORPHINE SULFATE (PF) 2 MG/ML IV SOLN
2.0000 mg | INTRAVENOUS | Status: DC | PRN
  Administered 2016-10-27 – 2016-10-28 (×2): 2 mg via INTRAVENOUS
  Filled 2016-10-27 (×2): qty 1

## 2016-10-27 MED ORDER — MELOXICAM 7.5 MG PO TABS
15.0000 mg | ORAL_TABLET | Freq: Every day | ORAL | Status: DC
  Administered 2016-10-27 – 2016-10-28 (×2): 15 mg via ORAL
  Filled 2016-10-27: qty 2
  Filled 2016-10-27: qty 1

## 2016-10-27 MED ORDER — METHOCARBAMOL 500 MG PO TABS: 500.0000 mg | ORAL_TABLET | Freq: Three times a day (TID) | ORAL | Status: DC | PRN

## 2016-10-27 MED ORDER — MORPHINE SULFATE 15 MG PO TABS: 15.0000 mg | ORAL_TABLET | ORAL | Status: DC | PRN

## 2016-10-27 NOTE — Care Management Note (Addendum)
Case Management Note  Patient Details  Name: Julia Luna MRN: [id number] Date of Birth: August 02, 1986  Subjective/Objective:  Presents with AMS, HA, and difficulty walking, with hx of fibramyalgia, CT shows brain lesions and metastasis at multiple sites, primary unknown.  She had bx on 1/19 and became unresponsive, transferred to sdu, conts on iv dilaudid and iv pain meds and lactulose.  Patient lives alone, she does not have a pcp or insurance, parents at bedside,  NCM gave her the [facility] clinic brochure,informed her to call on Monday 1/29 to schedule hospital follow up apt,  (apts are full).  Also NCM may need to ast with Match Letter for medications at dc.  Await pt eval .                     Action/Plan:   Expected Discharge Date:                  Expected Discharge Plan:  Home/Self Care  In-House Referral:     Discharge planning Services  CM Consult, [facility], [facility]  Post Acute Care Choice:    Choice offered to:  Patient  DME Arranged:    DME Agency:     HH Arranged:    [place] Agency:     Status of Service:  In process, will continue to follow  If discussed at Long Length of Stay Meetings, dates discussed:    Additional Comments:  Zenon Mayo, RN 10/27/2016, 12:47 PM

## 2016-10-27 NOTE — Progress Notes (Signed)
IP PROGRESS NOTE  Subjective:   Events noted in the last 48 hours. She feels reasonably improved overnight. Hot flashes noted to have a lesser degree. She still reports some headaches but no seizure activity noted.  Objective:  Vital signs in last 24 hours: Temp:  [97.6 F (36.4 C)-99 F (37.2 C)] 98.1 F (36.7 C) (01/22 0720) Pulse Rate:  [57-76] 60 (01/22 0720) Resp:  [11-19] 13 (01/22 0720) BP: (126-143)/(81-102) 126/81 (01/22 0720) SpO2:  [93 %-97 %] 97 % (01/22 0720) Weight change:  Last BM Date: 10/26/16  Intake/Output from previous day: 01/21 0701 - 01/22 0700 In: 1850 [I.V.:1800; IV Piggyback:50] Out: 2125 [Urine:2125] Chronically ill-appearing woman without distress. Mouth: mucous membranes moist, pharynx normal without lesions Resp: clear to auscultation bilaterally Cardio: regular rate and rhythm, S1, S2 normal, no murmur, click, rub or gallop GI: soft, non-tender; bowel sounds normal; no masses,  no organomegaly Extremities: extremities normal, atraumatic, no cyanosis or edema Neurological: No deficits noted.  Lab Results:  Recent Labs  10/25/16 0240 10/26/16 0930  WBC 11.5* 12.7*  HGB 13.9 14.3  HCT 40.3 41.2  PLT 270 213    BMET  Recent Labs  10/25/16 0240 10/26/16 0930  NA 136 135  K 4.0 4.1  CL 103 103  CO2 22 23  GLUCOSE 112* 108*  BUN 10 9  CREATININE 0.69 0.75  CALCIUM 9.1 8.7*    Studies/Results: Dg Abd Portable 1v  Result Date: 10/26/2016 CLINICAL DATA:  Constipation, nausea, vomiting EXAM: PORTABLE ABDOMEN - 1 VIEW COMPARISON:  None. FINDINGS: There is gaseous distention of small bowel and colon. No radio-opaque calculi or other significant radiographic abnormality are seen. IMPRESSION: Gaseous distension of small bowel colon. No definite bowel obstruction. Electronically Signed   By: Kathreen Devoid   On: 10/26/2016 11:52    Medications: I have reviewed the patient's current medications.  Assessment/Plan:  31 year old woman  with the following:  1. Ovarian masses, adrenal gland, lymphadenopathy and pulmonary lesions. These findings are suspicious for an advanced malignancy. She is status post biopsy obtained on 10/24/2016. There is also currently pending.  We will await the pathology results which will dictate future treatment plans. The differential diagnosis was reviewed again which include melanoma, sarcoma among others.  2. Brain metastasis: Currently on dexamethasone which have helped her symptoms. Once malignancy is confirmed, we'll ask radiation oncology for an opinion regarding palliative radiation therapy to the brain.  3. Follow-up: We'll arrange follow-up for her at the [facility] upon discharge. Any treatment for her cancer will take place as an outpatient basis.   LOS: 4 days   [facility] 10/27/2016, 7:39 AM

## 2016-10-27 NOTE — Progress Notes (Signed)
Patient is more awake and alert. Pt is following commands with less delay and is up in the chair. Parents at the bedside and assisting patient when needed.

## 2016-10-27 NOTE — Progress Notes (Signed)
PT Cancellation Note  Patient Details Name: Julia Luna MRN: [id number] DOB: [birth year]   Cancelled Treatment:    Reason Eval/Treat Not Completed: Fatigue/lethargy limiting ability to participate. Attempted to perform evaluation, at this time, family requesting that patient not be disturbed at this time. Will follow up for evaluation next day as able.   Duncan Dull 10/27/2016, 3:19 PM Alben Deeds, [place] DPT  [phone]

## 2016-10-27 NOTE — Progress Notes (Addendum)
[facility] Progress Note  Patient: Julia Luna FGH:[id number]   PCP: No PCP Per Patient DOB: [birth year]   DOA: 10/23/2016   DOS: 10/27/2016   Date of Service: the patient was seen and examined on 10/27/2016  Brief hospital course: Pt. with PMH of Fibromyalgia, active smoker; admitted on 10/23/2016, with complaint of confusion and headache, was found to have multiple metastatic lesions involving brain, right upper lobe of lung, ovary, adrenal. Patient underwent biopsy of Left inguinal node on 10/24/2016. Oncology consulted. On the night of 10/24/2016 patient had an episode of unresponsiveness etiology of which is unclear. Complains about generalized body ache. Currently further plan is supportive management and await the results of the biopsy Along with minimizing narcotics.  Assessment and Plan: 1. 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 Acute encephalopathy, headache. Patient presented with complaints of headache as well as confusion. Further workup involved a CT head which showed multiple brain lesions with vasogenic edema without any midline shift. MRI brain with and without contrast verified the finding. CT chest abdomen and pelvis shows multiple pulmonary nodule, bilateral ovarian mass, left inguinal lymphadenopathy, bilateral adrenal mass. Patient underwent biopsy of the left inguinal node on 10/24/2016. Oncology consulted. On IV Decadron for CNS lesions. We will monitor biopsy results which would likely be available on Tuesday at the earliest. PT consulted, discuss with family regarding impact of performance status for any kind of treatment available depending on the biopsy result.  2. Acute encephalopathy. Fibromyalgia Unresponsive episode.  Patient had an episode of unresponsiveness on 10/24/2016, she did not lose pulse there was no abnormality of the telemetry. CODE BLUE was called despite this. No seizures, never lost consciousness  per CODE BLUE note. Her workup at that time was unremarkable. ABG was unremarkable as well. At the time of my evaluation the patient remains stable. Mild elevation of ammonia, I would continue the patient on lactulose. Mild elevation of the CK likely secondary to immobilization. Normal ESR normal CRP normal lactic acid level essentially rule out any evidence of infection at present or vasculitis. TSH and free T4 also normal. Negative blood cultures.  3. Autonomic dysfunction. Hot flushes, sinus bradycardia Episodes reducing in frequency. Supportive management for the treatment. Per family patient has been having issues with maintaining her body temperature and there will be episodes during which patient will have hot flushes. Reportedly the temperature taken at the end of the event was normal. Requested family to notify RN whenever she has this events. There was also evidence of bradycardia on telemetry with 1 event documented as type II heart block. No further similar heart blocks in 48 hours. Unremarkable EKG. Metabolic encephalopathy per EEG.  4. Fibromyalgia Suspected depression. Patient does have history of fibromyalgia currently complaining of generalized body pain. Requiring high dose of IV Dilaudid and Percocet. At present I would start the patient on Cymbalta 30 mg starting dose and in 1 week recommend to increase to 60 mg daily. On morphine IV that I would prefer to be used less, family agrees. After discussing with the family also changing Percocet to MS Contin IR. Changing Robaxin from scheduled to when necessary.  5. Headache. Resolved associated with malignancy. On Decadron scheduled. Also adding Tylenol and Fioricet.  Bowel regimen: last BM 10/27/2016 Diet: Regular diet DVT Prophylaxis: SCD  Advance goals of care discussion: Full code awaiting biopsy results.  Family Communication: family was present at bedside, at the time of interview. The pt provided  permission to discuss medical plan  with the family. Opportunity was given to ask question and all questions were answered satisfactorily.   Disposition:  Discharge to home. Expected discharge date: 10/28/2016, biopsy results, PT eval and further workup.  Consultants: Oncology Procedures: EEG, inguinal biopsy  Antibiotics: Anti-infectives    None      Subjective: Remains sleepy,  Objective: Physical Exam: Vitals:   10/27/16 1100 10/27/16 1324 10/27/16 1628 10/27/16 1644  BP: 125/76   120/89  Pulse: 82   (!) 55  Resp: 11   13  Temp: 97.7 F (36.5 C) 99 F (37.2 C) 97.8 F (36.6 C) 99.4 F (37.4 C)  TempSrc: Oral Oral Oral Oral  SpO2: 96%   (!) 88%  Weight:      Height:        Intake/Output Summary (Last 24 hours) at 10/27/16 1651 Last data filed at 10/27/16 0600  Gross per 24 hour  Intake             1125 ml  Output             1500 ml  Net             -375 ml   Filed Weights   10/25/16 1635  Weight: 97.2 kg (214 lb 4.6 oz)    General: Alert, Awake and Oriented to Time, Place and Person. Appear in mild distress, affect appropriate Eyes: PERRL, Conjunctiva normal ENT: Oral Mucosa clear moist. Neck: no JVD, no Abnormal Mass Or lumps Cardiovascular: S1 and S2 Present, no Murmur, Respiratory: Bilateral Air entry equal and Decreased, no use of accessory muscle, Clear to Auscultation, no Crackles, no wheezes Abdomen: Bowel Sound present, Soft and no tenderness Skin: no redness, no Rash, no induration Extremities: no Pedal edema, no calf tenderness Neurologic: Grossly no focal neuro deficit. Bilaterally Equal motor strength  Data Reviewed: CBC:  Recent Labs Lab 10/23/16 1526 10/24/16 0615 10/25/16 0240 10/26/16 0930  WBC 10.7* 7.0 11.5* 12.7*  NEUTROABS  --  5.6 9.2* 11.1*  HGB 13.0 13.4 13.9 14.3  HCT 37.5 38.6 40.3 41.2  MCV 89.9 89.6 90.6 90.7  PLT 235 211 270 341   Basic Metabolic Panel:  Recent Labs Lab 10/23/16 1526 10/24/16 0615  10/25/16 0240 10/26/16 0930  NA 137 137 136 135  K 3.2* 3.5 4.0 4.1  CL 103 103 103 103  CO2 '23 23 22 23  '$ GLUCOSE 101* 110* 112* 108*  BUN '6 8 10 9  '$ CREATININE 0.75 0.74 0.69 0.75  CALCIUM 9.2 8.5* 9.1 8.7*  MG  --  2.2  --  1.6*    Liver Function Tests:  Recent Labs Lab 10/23/16 1526 10/26/16 0930  AST 21 33  ALT 26 58*  ALKPHOS 67 60  BILITOT 0.6 0.6  PROT 7.0 6.0*  ALBUMIN 3.9 3.4*   No results for input(s): LIPASE, AMYLASE in the last 168 hours.  Recent Labs Lab 10/25/16 1114  AMMONIA 60*   Coagulation Profile:  Recent Labs Lab 10/24/16 0928  INR 1.15   Cardiac Enzymes:  Recent Labs Lab 10/25/16 1114  CKTOTAL 268*   BNP (last 3 results) No results for input(s): PROBNP in the last 8760 hours.  CBG:  Recent Labs Lab 10/23/16 1702 10/25/16 0238  GLUCAP 111* 97    Studies: No results found.   Scheduled Meds: . dexamethasone  4 mg Intravenous Q6H  . DULoxetine  30 mg Oral Daily  . lactulose  20 g Oral BID  . meloxicam  15 mg Oral Daily  .  polyethylene glycol  17 g Oral BID  . sodium chloride flush  3 mL Intravenous Q12H   Continuous Infusions:  PRN Meds: acetaminophen, butalbital-acetaminophen-caffeine, methocarbamol, morphine, morphine injection, ondansetron **OR** ondansetron (ZOFRAN) IV  Time spent: 30 minutes  Author: Berle Mull, MD [facility] Pager: [phone] 10/27/2016 4:51 PM  If 7PM-7AM, please contact night-coverage at [email or web address], password [facility]

## 2016-10-27 NOTE — Progress Notes (Signed)
Spoke with Dr. Posey Pronto about the possibility of goals of care/palliative care consult being ordered. Dr. Posey Pronto does not want that consult ordered at this time and he reported that he is providing goals of care each day with patient and family.

## 2016-10-28 DIAGNOSIS — C499 Malignant neoplasm of connective and soft tissue, unspecified: Secondary | ICD-10-CM

## 2016-10-28 DIAGNOSIS — C7931 Secondary malignant neoplasm of brain: Principal | ICD-10-CM

## 2016-10-28 MED ORDER — METHOCARBAMOL 500 MG PO TABS: 500.0000 mg | ORAL_TABLET | Freq: Three times a day (TID) | ORAL | 0 refills | Status: DC | PRN

## 2016-10-28 MED ORDER — POLYETHYLENE GLYCOL 3350 17 G PO PACK: 17.0000 g | PACK | Freq: Every day | ORAL | 0 refills | Status: AC

## 2016-10-28 MED ORDER — POLYETHYLENE GLYCOL 3350 17 G PO PACK: 17.0000 g | PACK | Freq: Every day | ORAL | Status: DC | PRN

## 2016-10-28 MED ORDER — DEXAMETHASONE 4 MG PO TABS: 4.0000 mg | ORAL_TABLET | Freq: Four times a day (QID) | ORAL | 0 refills | Status: DC

## 2016-10-28 MED ORDER — MELOXICAM 15 MG PO TABS: 15.0000 mg | ORAL_TABLET | Freq: Every day | ORAL | 0 refills | Status: DC

## 2016-10-28 MED ORDER — BUTALBITAL-APAP-CAFFEINE 50-325-40 MG PO TABS: 1.0000 | ORAL_TABLET | ORAL | 0 refills | Status: AC | PRN

## 2016-10-28 MED ORDER — DULOXETINE HCL 60 MG PO CPEP: 60.0000 mg | ORAL_CAPSULE | Freq: Every day | ORAL | 0 refills | Status: DC

## 2016-10-28 MED ORDER — LACTULOSE 10 GM/15ML PO SOLN: 20.0000 g | Freq: Two times a day (BID) | ORAL | Status: DC | PRN

## 2016-10-28 MED ORDER — MORPHINE SULFATE 15 MG PO TABS: 15.0000 mg | ORAL_TABLET | Freq: Four times a day (QID) | ORAL | 0 refills | Status: DC | PRN

## 2016-10-28 NOTE — Care Management Note (Signed)
Case Management Note  Patient Details  Name: Julia Luna MRN: [id number] Date of Birth: November 25, 1985  Subjective/Objective:                 Patient to DC to home today. Patient does not qualify for [place] through charity. CM explained this to patient's mom over the phone, she verbalized understanding. CM spoke with after hours ED RN CM Mariann Laster, she will provide patient with [facility] letter. [place] program explained to patient's mother as well. Verified with patient that she has [facility] brochure and knows to schedule follow up appointment Monday.   Action/Plan: DC to home with family. Expected Discharge Date:  10/28/16               Expected Discharge Plan:  Home/Self Care  In-House Referral:     Discharge planning Services  CM Consult, [place], [facility]  Post Acute Care Choice:    Choice offered to:  Patient  DME Arranged:    DME Agency:     HH Arranged:    [place] Agency:     Status of Service:  Completed, signed off  If discussed at [employer], dates discussed:    Additional Comments:  Carles Collet, RN 10/28/2016, 5:17 PM

## 2016-10-28 NOTE — Progress Notes (Signed)
IP PROGRESS NOTE  Subjective:   Julia Luna feels much better today. She is out of bed and ambulating without any much difficulties. Her appetite is been better at this time. She reports her headache is much improved and does not recall any neurological complaints.  Objective:  Vital signs in last 24 hours: Temp:  [97.7 F (36.5 C)-99.4 F (37.4 C)] 97.7 F (36.5 C) (01/23 1514) Pulse Rate:  [55-87] 87 (01/23 1514) Resp:  [13-20] 20 (01/23 1514) BP: (120-131)/(71-89) 129/76 (01/23 1514) SpO2:  [88 %-96 %] 96 % (01/23 1514) Weight change:  Last BM Date: 10/28/16  Intake/Output from previous day: Alert, awake: Without distress. Mouth: mucous membranes moist, pharynx normal without lesions Resp: clear to auscultation bilaterally Cardio: regular rate and rhythm, S1, S2 normal, no murmur, click, rub or gallop GI: soft, non-tender; bowel sounds normal; no masses,  no organomegaly Extremities: extremities normal, atraumatic, no cyanosis or edema Neurological: No deficits noted. Skin examination: No obvious masses or lesions noted.  Lab Results:  Recent Labs  10/26/16 0930  WBC 12.7*  HGB 14.3  HCT 41.2  PLT 213    BMET  Recent Labs  10/26/16 0930  NA 135  K 4.1  CL 103  CO2 23  GLUCOSE 108*  BUN 9  CREATININE 0.75  CALCIUM 8.7*    Studies/Results: No results found.  Medications: I have reviewed the patient's current medications.  Assessment/Plan:  31 year old woman with the following:  1. Metastatic melanoma presented with Ovarian masses, adrenal gland, lymphadenopathy and pulmonary lesions. She is status post biopsy obtained on 10/24/2016.   The results of the pathology was discussed with Dr. Lyndon Code reviewed with the patient and her family. Additional testing to determine whether her melanoma is BRAF mutated is currently ongoing.  The natural course of this disease was discussed with the patient and her family. She will require systemic therapy which will be  determined by her BRAF status. Immunotherapy which also be a possibility in the form of combination of Ipi/Nivo.   We'll arrange follow-up upon her discharge to discuss these options further.  2. Brain metastasis: Currently on dexamethasone which have helped her symptoms. She is scheduled to start radiation therapy in the near future after discussing the case with Dr. Tammi Klippel from radiation oncology. She has follow-up set up on 10/29/2016. She will be discharged on oral dexamethasone which will be tapered as an outpatient setting.  3. Disposition: She appears to be ready for discharge and she will have follow-up from radiation therapy on 10/29/2016. To have medical oncology follow up on 11/03/2016.   LOS: 5 days   Philopater Mucha 10/28/2016, 4:19 PM

## 2016-10-28 NOTE — Progress Notes (Signed)
Patient discharged to home with belongings, ivs and tele. avs and prescriptions given, all questions answered, patient stable at time of discharge. Case manager set up medication assistance for patient. Patient stable at time of discharge.

## 2016-10-28 NOTE — Care Management Note (Signed)
Case Management Note  Patient Details  Name: Julia Luna MRN: [id number] Date of Birth: [birth year]  Subjective/Objective:                    Action/Plan: This CM met with patient initially in the ED upon admission. Patient's mom Cherine Drumgoole [phone] is her support person. Patient is uninsured without a PCP for follow up.  Patient will be starting on Radiation Oncology Therapy tomorrow at [facility]. CM discussed the [facility] medications assistance program and [facility] on admission, and patient is agreeable to establish care with the clinic. CM will contact clinic's CM to call patient with scheduled appointment,  Patient enrolled in [facility] program guidelines reiterated to patient. Alex letter printed and  given to patient with instructions on how to redeem. Patient verbalized understanding teach back done. No additional question or concerns verbalized .No further CM needs identified.    Expected Discharge Date:  10/28/16               Expected Discharge Plan:  Home/Self Care  In-House Referral:     Discharge planning Services  CM Consult, [place], [facility]  Post Acute Care Choice:    Choice offered to:  Patient  DME Arranged:    DME Agency:     HH Arranged:    [place] Agency:     Status of Service:  Completed, signed off  If discussed at [employer], dates discussed:    Additional CommentsLaurena Slimmer, RN 10/28/2016, 5:30 PM

## 2016-10-28 NOTE — Progress Notes (Signed)
Father stated Pt was having loose stools. None at this time.

## 2016-10-28 NOTE — Discharge Summary (Signed)
[facility] Discharge Summary   Patient: Julia Luna RWE:[id number]   PCP: No PCP Per Patient DOB: [birth year]   Date of admission: 10/23/2016   Date of discharge:  10/28/2016    Discharge Diagnoses:  Principal Problem:   Metastasis of unknown primary [facility]) Active Problems:   Brain mass   Hypokalemia   Bradycardia   Tobacco use disorder   Fibromyalgia   Unresponsive episode   Headache   Acute encephalopathy   Admitted From: home Disposition:  home  Recommendations for Outpatient Follow-up:  1. Please follow up with PCP in 1 week 2. Please follow up with rad onc tomorrow 3. Please follow up oncology as Recommended   Follow-up [place] Follow up.   Why:  Call on Monday 1/29 to make follow up apt  Contact information: [address] [place] [address] [phone]       [facility], MD. Schedule an appointment as soon as possible for a visit in 2 week(s).   Specialty:  Oncology Contact information: [place]. [place] [address] [phone]          Diet recommendation: regular diet  Activity: The patient is advised to gradually reintroduce usual activities.  Discharge Condition: good  Code Status: full code  History of present illness: As per the H and P dictated on admission, " Rickayla CHLOIE LONEY is a 31 y.o. female with medical history significant of fibromyalgia, osteoarthritis who comes to the ED brought by her parents with progressively worse altered mental status and headache for several days.   Apparently, the patient has not been behaving like usual. They got really concerned, when she missed work on Tuesday, which is very unusual for her. The patient has been having headaches, chills, but no fever. She is unable to provide further history at this time.  "  Hospital Course:   Summary of her active problems in the hospital is as following. 1. 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 Acute encephalopathy, headache. Patient presented with complaints of headache as well as confusion. Further workup involved a CT head which showed multiple brain lesions with vasogenic edema without any midline shift. MRI brain with and without contrast verified the finding. CT chest abdomen and pelvis shows multiple pulmonary nodule, bilateral ovarian mass, left inguinal lymphadenopathy, bilateral adrenal mass. Patient underwent biopsy of the left inguinal node on 10/24/2016. Oncology consulted. On IV Decadron for CNS lesions. PT consulted, discuss with family regarding impact of performance status for any kind of treatment available Biopsy shows metastatic melanoma Radiation oncology consulted, pt will follow up with them tomorrow Also pt will follow up with medical oncology  2. Acute encephalopathy. Fibromyalgia Unresponsive episode.  Patient had an episode of unresponsiveness on 10/24/2016, she did not lose pulse there was no abnormality of the telemetry. CODE BLUE was called despite this. No seizures, never lost consciousness per CODE BLUE note. Her workup at that time was unremarkable. ABG was unremarkable as well. At the time of my evaluation the patient remains stable. Mild elevation of ammonia, I would continue the patient on lactulose. Mild elevation of the CK likely secondary to immobilization. Normal ESR normal CRP normal lactic acid level essentially rule out any evidence of infection at present or vasculitis. TSH and free T4 also normal. Negative blood cultures.  3. Autonomic dysfunction. Hot flushes, sinus bradycardia Episodes reducing in frequency. Supportive management for the treatment. Per family patient has  been having issues with maintaining her body temperature and there will be episodes during which patient will have hot flushes. Reportedly the temperature taken at the end of the event was  normal. Requested family to notify RN whenever she has this events. There was also evidence of bradycardia on telemetry with 1 event documented as type II heart block. No further similar heart blocks in 72 hours. Unremarkable EKG. Metabolic encephalopathy per EEG.  4. Fibromyalgia Suspected depression. Patient does have history of fibromyalgia currently complaining of generalized body pain. Requiring high dose of IV Dilaudid and Percocet. At present I would start the patient on Cymbalta 60 mg daily. After discussing with the family also changing Percocet to MS Contin IR.  5. Headache. Resolved associated with malignancy. On Decadron scheduled. Also adding Tylenol and Fioricet.  All other chronic medical condition were stable during the hospitalization.  Patient was seen by physical therapy, who recommended home health, which was arranged by Education officer, museum and case Freight forwarder. On the day of the discharge the patient's vitals were stable , and no other acute medical condition were reported by patient. the patient was felt safe to be discharge at home with home health.  Procedures and Results:  EEG, inguinal biopsy   Consultations:  Oncology  IR  DISCHARGE MEDICATION: Current Discharge Medication List    START taking these medications   Details  butalbital-acetaminophen-caffeine (FIORICET, ESGIC) 50-325-40 MG tablet Take 1 tablet by mouth every 4 (four) hours as needed for headache. Qty: 14 tablet, Refills: 0    dexamethasone (DECADRON) 4 MG tablet Take 1 tablet (4 mg total) by mouth 4 (four) times daily. Qty: 120 tablet, Refills: 0    DULoxetine (CYMBALTA) 60 MG capsule Take 1 capsule (60 mg total) by mouth daily. Qty: 60 capsule, Refills: 0    meloxicam (MOBIC) 15 MG tablet Take 1 tablet (15 mg total) by mouth daily. Qty: 14 tablet, Refills: 0    methocarbamol (ROBAXIN) 500 MG tablet Take 1 tablet (500 mg total) by mouth every 8 (eight) hours as needed for muscle  spasms. Qty: 30 tablet, Refills: 0    morphine (MSIR) 15 MG tablet Take 1 tablet (15 mg total) by mouth every 6 (six) hours as needed for severe pain. Qty: 20 tablet, Refills: 0    polyethylene glycol (MIRALAX / GLYCOLAX) packet Take 17 g by mouth daily. Qty: 14 each, Refills: 0      STOP taking these medications     aspirin EC 325 MG tablet      aspirin-acetaminophen-caffeine (EXCEDRIN MIGRAINE) 250-250-65 MG tablet      ibuprofen (ADVIL,MOTRIN) 200 MG tablet      clindamycin (CLEOCIN) 150 MG capsule      HYDROcodone-acetaminophen (NORCO/VICODIN) 5-325 MG per tablet        Allergies  Allergen Reactions  . Other Hives    NO "-CILLIN(s)"  . Penicillins Hives    Has patient had a PCN reaction causing immediate rash, facial/tongue/throat swelling, SOB or lightheadedness with hypotension: Yes Has patient had a PCN reaction causing severe rash involving mucus membranes or skin necrosis: No Has patient had a PCN reaction that required hospitalization: No Has patient had a PCN reaction occurring within the last 10 years: No If all of the above answers are "NO", then may proceed with Cephalosporin use.    Discharge Instructions    Diet general    Complete by:  As directed    Discharge instructions    Complete by:  As directed  It is important that you read following instructions as well as go over your medication list with RN to help you understand your care after this hospitalization.  Discharge Instructions: Please follow-up with PCP in one week  Please request your primary care physician to go over all Hospital Tests and Procedure/Radiological results at the follow up,  Please get all Hospital records sent to your PCP by signing hospital release before you go home.   Do not drive, operating heavy machinery, perform activities at heights, swimming or participation in water activities or provide baby sitting services while your are on Pain, Sleep and Anxiety Medications;  until you have been seen by Primary Care Physician or a Neurologist and advised to do so again. Do not take more than prescribed Pain, Sleep and Anxiety Medications. You were cared for by a hospitalist during your hospital stay. If you have any questions about your discharge medications or the care you received while you were in the hospital after you are discharged, you can call the unit and ask to speak with the hospitalist on call if the hospitalist that took care of you is not available.  Once you are discharged, your primary care physician will handle any further medical issues. Please note that NO REFILLS for any discharge medications will be authorized once you are discharged, as it is imperative that you return to your primary care physician (or establish a relationship with a primary care physician if you do not have one) for your aftercare needs so that they can reassess your need for medications and monitor your lab values. You Must read complete instructions/literature along with all the possible adverse reactions/side effects for all the Medicines you take and that have been prescribed to you. Take any new Medicines after you have completely understood and accept all the possible adverse reactions/side effects. Wear Seat belts while driving. If you have smoked or chewed Tobacco in the last 2 yrs please stop smoking and/or stop any Recreational drug use.   Driving Restrictions    Complete by:  As directed    Do not drive until cleared by PCP to drive while on pain medication   Increase activity slowly    Complete by:  As directed      Discharge Exam: Filed Weights   10/25/16 1635  Weight: 97.2 kg (214 lb 4.6 oz)   Vitals:   10/28/16 0548 10/28/16 1514  BP: 131/77 129/76  Pulse: 61 87  Resp: 20 20  Temp: 98.2 F (36.8 C) 97.7 F (36.5 C)   General: Appear in no distress, no Rash; Oral Mucosa moist. Cardiovascular: S1 and S2 Present, no Murmur, no JVD Respiratory: Bilateral  Air entry present and Clear to Auscultation, no Crackles, no wheezes Abdomen: Bowel Sound present, Soft and no tenderness Extremities: no Pedal edema, n calf tenderness Neurology: Grossly no focal neuro deficit.  The results of significant diagnostics from this hospitalization (including imaging, microbiology, ancillary and laboratory) are listed below for reference.    Significant Diagnostic Studies: Ct Head Wo Contrast  Result Date: 10/23/2016 CLINICAL DATA:  Intermittent headache for 1 week with vomiting. EXAM: CT HEAD WITHOUT CONTRAST TECHNIQUE: Contiguous axial images were obtained from the base of the skull through the vertex without intravenous contrast. COMPARISON:  12/07/2004 FINDINGS: Brain: There are multiple apparent hyperattenuating masses along the corticomedullary junctions of both cerebral hemispheres. In the right frontal lobe there is a 19 mm lesion. In the left posteromedial parietal lobe there is an 18 mm lesion.  In the right parietal lobe superiorly and medially there is a 2.3 cm lesion. There multiple additional smaller lesions. There is surrounding white matter hypoattenuation consistent with vasogenic edema. There is no midline shift. There is some mass effect reflected by sulcal effacement. The ventricles are normal in overall size and configuration. There is a questionable small lesion in the right cerebellum. There is no evidence of an infarct. There are no extra-axial masses or abnormal fluid collections. There is no intracranial hemorrhage. Vascular: No hyperdense vessel or unexpected calcification. Skull: Normal. Negative for fracture or focal lesion. Sinuses/Orbits: Globes and orbits unremarkable. Visualized sinuses are essentially clear. Other: None. IMPRESSION: 1. Now multiple relatively hyperattenuating brain masses with associated vasogenic edema consistent with metastatic disease. Followup brain MRI with and without contrast is recommended. Electronically Signed   By:  Lajean Manes M.D.   On: 10/23/2016 16:19   Ct Chest W Contrast  Result Date: 10/23/2016 CLINICAL DATA:  Diagnosed with brain mass today. EXAM: CT CHEST, ABDOMEN, AND PELVIS WITH CONTRAST TECHNIQUE: Multidetector CT imaging of the chest, abdomen and pelvis was performed following the standard protocol during bolus administration of intravenous contrast. CONTRAST:  153m ISOVUE-300 IOPAMIDOL (ISOVUE-300) INJECTION 61% COMPARISON:  None. FINDINGS: CT CHEST FINDINGS Cardiovascular: Heart size is normal. No pericardial effusion. Thoracic aorta is normal in caliber. Mediastinum/Nodes: No mass or enlarged lymph nodes seen within the mediastinum, perihilar or axillary regions. Esophagus appears normal. Thyroid gland appears normal. Trachea and central bronchi are unremarkable. Lungs/Pleura: Scattered pulmonary nodules scattered throughout both lungs, at least 10 on the right and at least 7 on the left. Largest pulmonary nodule is within the right upper lobe measuring 2.2 x 2.2 x 1.7 cm (series 40, image 54). Largest nodule on the left is located within the left upper lobe, inferior segment posteriorly, measuring 2 x 1.6 x 1.5 cm (series 4, image 47). There are also small bilateral effusions with adjacent atelectasis. Musculoskeletal: No acute or suspicious osseous finding. Superficial soft tissues are unremarkable. CT ABDOMEN PELVIS FINDINGS Hepatobiliary: Status post cholecystectomy. No focal mass or lesion within the liver. No bile duct dilatation. Pancreas: Unremarkable. No pancreatic ductal dilatation or surrounding inflammatory changes. Spleen: Normal in size without focal abnormality. Adrenals/Urinary Tract: Bilateral adrenal masses, measuring approximately 2.4 x 2.2 cm on the left and approximately 2 x 1.4 cm on the right. Both kidneys appear normal without mass, stone or hydronephrosis. No ureteral or bladder calculi identified. Bladder appears normal. Stomach/Bowel: Bowel is normal in caliber. No bowel wall  thickening or evidence of bowel wall inflammation seen. Appendix appears normal. Stomach appears normal. Vascular/Lymphatic: No vascular abnormality appreciated. Scattered small lymph nodes noted within the bilateral iliac chain regions and retroperitoneum. No pathologically enlarged lymph nodes appreciated within the abdomen or pelvis. There is an enlarged/morphologically abnormal lymph node within the left inguinal region, with central hypodensity suspicious for necrosis, measuring 3.5 x 2.3 cm. Reproductive: Solid-appearing mass within the left adnexal region measures 3.2 x 2.3 cm, likely contiguous with the left ovary. Additional cystic-appearing mass within the right adnexal region measures 6.6 x 4.2 cm, likely contiguous with the right ovary. Small amount of free fluid within the adjacent cul-de-sac. Other: None Musculoskeletal: No acute or suspicious osseous finding. IMPRESSION: 1. Multiple scattered pulmonary nodules within each lung, consistent with metastases. Largest nodule is located within the right upper lobe measuring 2.2 x 2.2 x 1.7 cm. 2. Small bilateral pleural effusions. 3. Solid-appearing mass within the left adnexal region measuring 3.2 x 2.3 cm. Additional  cystic-appearing mass within the right adnexal region measuring 6.6 x 4.2 cm. 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 4. Enlarged/morphologically abnormal lymph node within the left inguinal region, with central hypodensity suspicious for necrosis, highly suspicious for metastatic lymph node. 5. Bilateral adrenal masses, also suspicious for metastatic disease. Electronically Signed   By: Franki Cabot M.D.   On: 10/23/2016 22:26   Mr Jeri Cos And Wo Contrast  Result Date: 10/23/2016 CLINICAL DATA:  Headache, confusion, difficulty walking beginning last week. Follow-up brain mass. EXAM: MRI HEAD WITHOUT AND WITH CONTRAST TECHNIQUE: Multiplanar, multiecho pulse sequences of the brain and surrounding structures were  obtained without and with intravenous contrast. CONTRAST:  84m MULTIHANCE GADOBENATE DIMEGLUMINE 529 MG/ML IV SOLN COMPARISON:  CT HEAD October 23, 2016 at 1600 hours. FINDINGS: INTRACRANIAL CONTENTS: Greater than 100 supra- and infratentorial parenchymal rounded enhancing masses with reduced diffusion within the larger lesions, their able T1 shortening and faint susceptibility artifact. Associated vasogenic edema. Largest lesions are 2.2 cm bilateral parietal lobes, 2.4 cm RIGHT frontal lobe. Local mass effect without midline shift. No extra-axial mass or abnormal enhancement. Ventricles and sulci are overall normal for patient's age. No abnormal extra-axial fluid collections. VASCULAR: Normal major intracranial vascular flow voids present at skull base. SKULL AND UPPER CERVICAL SPINE: No abnormal sellar expansion. No suspicious calvarial bone marrow signal. Craniocervical junction maintained. SINUSES/ORBITS: Trace mastoid effusions. The included ocular globes and orbital contents are non-suspicious. OTHER: None. IMPRESSION: Numerous supra- and infratentorial metastasis, with imaging characteristics of melanoma or mildly hemorrhagic metastasis. Local mass effect without midline shift. Acute findings discussed with and reconfirmed by Dr.NATHAN PICKERING on 10/23/2016 at 8:55 p.m. Electronically Signed   By: CElon AlasM.D.   On: 10/23/2016 21:07   Ct Abdomen Pelvis W Contrast  Result Date: 10/23/2016 CLINICAL DATA:  Diagnosed with brain mass today. EXAM: CT CHEST, ABDOMEN, AND PELVIS WITH CONTRAST TECHNIQUE: Multidetector CT imaging of the chest, abdomen and pelvis was performed following the standard protocol during bolus administration of intravenous contrast. CONTRAST:  1051mISOVUE-300 IOPAMIDOL (ISOVUE-300) INJECTION 61% COMPARISON:  None. FINDINGS: CT CHEST FINDINGS Cardiovascular: Heart size is normal. No pericardial effusion. Thoracic aorta is normal in caliber. Mediastinum/Nodes: No mass or  enlarged lymph nodes seen within the mediastinum, perihilar or axillary regions. Esophagus appears normal. Thyroid gland appears normal. Trachea and central bronchi are unremarkable. Lungs/Pleura: Scattered pulmonary nodules scattered throughout both lungs, at least 10 on the right and at least 7 on the left. Largest pulmonary nodule is within the right upper lobe measuring 2.2 x 2.2 x 1.7 cm (series 40, image 54). Largest nodule on the left is located within the left upper lobe, inferior segment posteriorly, measuring 2 x 1.6 x 1.5 cm (series 4, image 47). There are also small bilateral effusions with adjacent atelectasis. Musculoskeletal: No acute or suspicious osseous finding. Superficial soft tissues are unremarkable. CT ABDOMEN PELVIS FINDINGS Hepatobiliary: Status post cholecystectomy. No focal mass or lesion within the liver. No bile duct dilatation. Pancreas: Unremarkable. No pancreatic ductal dilatation or surrounding inflammatory changes. Spleen: Normal in size without focal abnormality. Adrenals/Urinary Tract: Bilateral adrenal masses, measuring approximately 2.4 x 2.2 cm on the left and approximately 2 x 1.4 cm on the right. Both kidneys appear normal without mass, stone or hydronephrosis. No ureteral or bladder calculi identified. Bladder appears normal. Stomach/Bowel: Bowel is normal in caliber. No bowel wall thickening or evidence of bowel wall inflammation seen. Appendix appears normal. Stomach appears normal. Vascular/Lymphatic: No vascular  abnormality appreciated. Scattered small lymph nodes noted within the bilateral iliac chain regions and retroperitoneum. No pathologically enlarged lymph nodes appreciated within the abdomen or pelvis. There is an enlarged/morphologically abnormal lymph node within the left inguinal region, with central hypodensity suspicious for necrosis, measuring 3.5 x 2.3 cm. Reproductive: Solid-appearing mass within the left adnexal region measures 3.2 x 2.3 cm, likely  contiguous with the left ovary. Additional cystic-appearing mass within the right adnexal region measures 6.6 x 4.2 cm, likely contiguous with the right ovary. Small amount of free fluid within the adjacent cul-de-sac. Other: None Musculoskeletal: No acute or suspicious osseous finding. IMPRESSION: 1. Multiple scattered pulmonary nodules within each lung, consistent with metastases. Largest nodule is located within the right upper lobe measuring 2.2 x 2.2 x 1.7 cm. 2. Small bilateral pleural effusions. 3. Solid-appearing mass within the left adnexal region measuring 3.2 x 2.3 cm. Additional cystic-appearing mass within the right adnexal region measuring 6.6 x 4.2 cm. 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 4. Enlarged/morphologically abnormal lymph node within the left inguinal region, with central hypodensity suspicious for necrosis, highly suspicious for metastatic lymph node. 5. Bilateral adrenal masses, also suspicious for metastatic disease. Electronically Signed   By: Franki Cabot M.D.   On: 10/23/2016 22:26   US Biopsy  Result Date: 10/24/2016 INDICATION: Left inguinal adenopathy EXAM: ULTRASOUND-GUIDED BIOPSY LEFT INGUINAL LYMPH NODE.  CORE. MEDICATIONS: None. ANESTHESIA/SEDATION: Fentanyl 25 mcg IV; Versed 1 mg IV Moderate Sedation Time:  8 minutes The patient was continuously monitored during the procedure by the interventional radiology nurse under my direct supervision. FLUOROSCOPY TIME:  Fluoroscopy Time:  minutes  seconds ( mGy). COMPLICATIONS: None immediate. PROCEDURE: Informed written consent was obtained from the patient after a thorough discussion of the procedural risks, benefits and alternatives. All questions were addressed. Maximal Sterile Barrier Technique was utilized including caps, mask, sterile gowns, sterile gloves, sterile drape, hand hygiene and skin antiseptic. A timeout was performed prior to the initiation of the procedure. The left inguinal region was  prepped with Chloraprep in a sterile fashion, and a sterile drape was applied covering the operative field. A sterile gown and sterile gloves were used for the procedure. Under sonographic guidance, 3 18 gauge core biopsies were obtained. Final imaging was performed. Patient tolerated the procedure well without complication. Vital sign monitoring by nursing staff during the procedure will continue as patient is in the special procedures unit for post procedure observation. FINDINGS: The images document guide needle placement within the left inguinal lymph node. Post biopsy images demonstrate no hemorrhage. IMPRESSION: Successful ultrasound-guided core biopsy of an enlarged left inguinal lymph node. Electronically Signed   By: Marybelle Killings M.D.   On: 10/24/2016 15:42   Dg Abd Portable 1v  Result Date: 10/26/2016 CLINICAL DATA:  Constipation, nausea, vomiting EXAM: PORTABLE ABDOMEN - 1 VIEW COMPARISON:  None. FINDINGS: There is gaseous distention of small bowel and colon. No radio-opaque calculi or other significant radiographic abnormality are seen. IMPRESSION: Gaseous distension of small bowel colon. No definite bowel obstruction. Electronically Signed   By: Kathreen Devoid   On: 10/26/2016 11:52    Microbiology: Recent Results (from the past 240 hour(s))  Culture, blood (routine x 2)     Status: None (Preliminary result)   Collection Time: 10/25/16 10:50 AM  Result Value Ref Range Status   Specimen Description BLOOD RIGHT HAND  Final   Special Requests IN PEDIATRIC BOTTLE 1CC  Final   Culture NO GROWTH 3 DAYS  Final   Report Status PENDING  Incomplete  Culture, blood (routine x 2)     Status: None (Preliminary result)   Collection Time: 10/25/16 11:15 AM  Result Value Ref Range Status   Specimen Description BLOOD LEFT ANTECUBITAL  Final   Special Requests IN PEDIATRIC BOTTLE 1CC  Final   Culture NO GROWTH 3 DAYS  Final   Report Status PENDING  Incomplete  MRSA PCR Screening     Status: None    Collection Time: 10/25/16  5:17 PM  Result Value Ref Range Status   MRSA by PCR NEGATIVE NEGATIVE Final    Comment:        The GeneXpert MRSA Assay (FDA approved for NASAL specimens only), is one component of a comprehensive MRSA colonization surveillance program. It is not intended to diagnose MRSA infection nor to guide or monitor treatment for MRSA infections.      Labs: CBC:  Recent Labs Lab 10/23/16 1526 10/24/16 0615 10/25/16 0240 10/26/16 0930  WBC 10.7* 7.0 11.5* 12.7*  NEUTROABS  --  5.6 9.2* 11.1*  HGB 13.0 13.4 13.9 14.3  HCT 37.5 38.6 40.3 41.2  MCV 89.9 89.6 90.6 90.7  PLT 235 211 270 111   Basic Metabolic Panel:  Recent Labs Lab 10/23/16 1526 10/24/16 0615 10/25/16 0240 10/26/16 0930  NA 137 137 136 135  K 3.2* 3.5 4.0 4.1  CL 103 103 103 103  CO2 '23 23 22 23  '$ GLUCOSE 101* 110* 112* 108*  BUN '6 8 10 9  '$ CREATININE 0.75 0.74 0.69 0.75  CALCIUM 9.2 8.5* 9.1 8.7*  MG  --  2.2  --  1.6*   Liver Function Tests:  Recent Labs Lab 10/23/16 1526 10/26/16 0930  AST 21 33  ALT 26 58*  ALKPHOS 67 60  BILITOT 0.6 0.6  PROT 7.0 6.0*  ALBUMIN 3.9 3.4*   No results for input(s): LIPASE, AMYLASE in the last 168 hours.  Recent Labs Lab 10/25/16 1114  AMMONIA 60*   Cardiac Enzymes:  Recent Labs Lab 10/25/16 1114  CKTOTAL 268*   BNP (last 3 results) No results for input(s): BNP in the last 8760 hours. CBG:  Recent Labs Lab 10/23/16 1702 10/25/16 0238  GLUCAP 111* 97   Time spent: 30 minutes  Signed:  Rayhaan Huster  [facility]  10/28/2016  , 4:13 PM

## 2016-10-28 NOTE — Evaluation (Signed)
Physical Therapy Evaluation Patient Details Name: Julia Luna MRN: [id number] DOB: 29-Apr-1986 Today's Date: 10/28/2016   History of Present Illness  Pt is a 31 y.o. female with medical history significant of fibromyalgia, osteoarthritis who comes to the ED brought by her parents with progressively worse altered mental status and headache for several days. She has been diagnosed with Ovarian masses, adrenal gland, lymphadenopathy and pulmonary lesions. 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 She is at risk for falls. Pt reports that her brain feels "scratchy" although she is able to talk about her work, work schedule, and family accurately.   Pt may benefit from further skilled PT to assist with mobility progression and discharge planning if she continues to stay on this venue of care.   Follow Up Recommendations Home health PT;Supervision/Assistance - 24 hour    Equipment Recommendations  3in1 (PT)    Recommendations for Other Services OT consult     Precautions / Restrictions Precautions Precautions: Fall Restrictions Weight Bearing Restrictions: No      Mobility  Bed Mobility Overal bed mobility: Independent             General bed mobility comments: supine to sit  Transfers Overall transfer level: Needs assistance Equipment used: 1 person hand held assist Transfers: Sit to/from Stand Sit to Stand: Min guard         General transfer comment: from chair and from toilet  Ambulation/Gait Ambulation/Gait assistance: Min assist Ambulation Distance (Feet): 200 Feet Assistive device: 1 person hand held assist Gait Pattern/deviations: Shuffle;Decreased step length - right;Decreased step length - left;Decreased dorsiflexion - left;Wide base of support (incoordinated)     General Gait Details: occasional LOB, reaches for external  support  Stairs Stairs: Yes Stairs assistance: Min assist Stair Management: One rail Left;Step to pattern;Forwards Number of Stairs: 12 General stair comments: reciprocating while ascending, step to descending  Wheelchair Mobility    Modified Rankin (Stroke Patients Only)       Balance Overall balance assessment: Needs assistance Sitting-balance support: No upper extremity supported;Feet supported Sitting balance-Leahy Scale: Good Sitting balance - Comments: supervision to perform perianal hygeine to maintain balance   Standing balance support: Single extremity supported Standing balance-Leahy Scale: Poor                               Pertinent Vitals/Pain Pain Assessment: No/denies pain    Home Living Family/patient expects to be discharged to:: Private residence Living Arrangements: Parent Available Help at Discharge: Family;Available 24 hours/day Type of Home: House Home Access: Stairs to enter Entrance Stairs-Rails: None Entrance Stairs-Number of Steps: 2 Home Layout: Two level;1/2 bath on main level;Bed/bath upstairs Home Equipment: None Additional Comments: Pt was living alone prior to admission, mother plans to work from home    Prior Function Level of Independence: Independent         Comments: worked as Programme researcher, 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    Extremity/Trunk Assessment   Upper Extremity Assessment Upper Extremity Assessment: Overall WFL for tasks assessed (5/5 shoulder flexion, abduct, elbow flexion bil)    Lower Extremity Assessment Lower Extremity Assessment: Overall WFL for tasks assessed (5/5 hip flexion, knee ext bil, Rt ankle DF, 4-/5 left DF)    Cervical / Trunk Assessment Cervical / Trunk  Assessment: Kyphotic  Communication   Communication: No difficulties  Cognition Arousal/Alertness: Awake/alert Behavior During Therapy: WFL for tasks assessed/performed Overall Cognitive Status:  Impaired/Different from baseline Area of Impairment: Problem solving             Problem Solving: Slow processing;Decreased initiation General Comments: Pt's father reports intermittent episodes of orientation vs confusion throughout day    General Comments General comments (skin integrity, edema, etc.): pt with slow processing, decr depth perception while reaching for door handle and for her stuffed animal    Exercises     Assessment/Plan    PT Assessment Patient needs continued PT services  PT Problem List Decreased strength;Decreased activity tolerance;Decreased balance;Decreased mobility;Decreased safety awareness;Decreased cognition          PT Treatment Interventions Gait training;Stair training;Functional mobility training;Therapeutic activities;DME instruction;Therapeutic exercise;Balance training;Neuromuscular re-education;Patient/family education    PT Goals (Current goals can be found in the Care Plan section)  Acute Rehab PT Goals Patient Stated Goal: go home today PT Goal Formulation: With patient/family Time For Goal Achievement: 11/07/16 Potential to Achieve Goals: Good    Frequency Min 3X/week   Barriers to discharge Inaccessible home environment family unsure if they can accommodate her on primary level    Co-evaluation               End of Session Equipment Utilized During Treatment: Gait belt Activity Tolerance: Patient tolerated treatment well Patient left: in chair;with call bell/phone within reach Nurse Communication: Mobility status         Time: [id number] PT Time Calculation (min) (ACUTE ONLY): 25 min   Charges:   PT Evaluation $PT Eval Low Complexity: 1 Procedure     PT G CodesSuszanne Finch, PT [phone]  [place] 10/28/2016, 5:06 PM

## 2016-10-28 NOTE — Progress Notes (Signed)
Phoned [place], RN on [address] for update and to determine is transportation for tomorrow's consult with Tammi Klippel needs to be arranged. Per [place], RN the present plan is to discharge the patient home this evening allowing the patient to follow up with oncology on an outpatient basis.

## 2016-10-29 ENCOUNTER — Ambulatory Visit
Admit: 2016-10-29 | Discharge: 2016-10-29 | Disposition: A | Discharge status: Home / Self Care | Payer: Medicaid Other | Attending: Radiation Oncology | Admitting: Radiation Oncology

## 2016-10-29 ENCOUNTER — Encounter: Payer: Self-pay | Admitting: Radiation Oncology

## 2016-10-29 ENCOUNTER — Ambulatory Visit
Admission: RE | Admit: 2016-10-29 | Discharge: 2016-10-29 | Disposition: A | Discharge status: Home / Self Care | Payer: Medicaid Other | Source: Ambulatory Visit | Attending: Radiation Oncology | Admitting: Radiation Oncology

## 2016-10-29 VITALS — BP 109/70 | HR 89 | Temp 98.0°F | Resp 18 | Wt 200.6 lb

## 2016-10-29 DIAGNOSIS — Z510 Encounter for antineoplastic radiation therapy: Secondary | ICD-10-CM | POA: Diagnosis present

## 2016-10-29 DIAGNOSIS — C799 Secondary malignant neoplasm of unspecified site: Secondary | ICD-10-CM

## 2016-10-29 DIAGNOSIS — C7931 Secondary malignant neoplasm of brain: Secondary | ICD-10-CM | POA: Insufficient documentation

## 2016-10-29 DIAGNOSIS — C7800 Secondary malignant neoplasm of unspecified lung: Secondary | ICD-10-CM | POA: Insufficient documentation

## 2016-10-29 DIAGNOSIS — C7949 Secondary malignant neoplasm of other parts of nervous system: Secondary | ICD-10-CM | POA: Insufficient documentation

## 2016-10-29 DIAGNOSIS — Z79899 Other long term (current) drug therapy: Secondary | ICD-10-CM | POA: Insufficient documentation

## 2016-10-29 DIAGNOSIS — C439 Malignant melanoma of skin, unspecified: Secondary | ICD-10-CM | POA: Insufficient documentation

## 2016-10-29 DIAGNOSIS — F17210 Nicotine dependence, cigarettes, uncomplicated: Secondary | ICD-10-CM | POA: Diagnosis not present

## 2016-10-29 DIAGNOSIS — Z880 Allergy status to penicillin: Secondary | ICD-10-CM | POA: Insufficient documentation

## 2016-10-29 HISTORY — DX: Unspecified malignant neoplasm of skin, unspecified: C44.90

## 2016-10-29 NOTE — Progress Notes (Signed)
See progress noted under physician encounter.  

## 2016-10-29 NOTE — Progress Notes (Signed)
Radiation Oncology         [phone] ________________________________  Initial Outpatient Consultation  Name: Julia Luna MRN: [id number]  Date: 10/29/2016  DOB: 1986-04-02  CC:No PCP Per Patient  No ref. provider found   REFERRING PHYSICIAN: No ref. provider found  DIAGNOSIS: 31 year old woman with Melanoma with with innumerable brain metastases.    ICD-9-CM ICD-10-CM   1. Secondary malignant neoplasm of brain and spinal cord (HCC) 198.3 C79.31     C79.49   2. Brain metastases (HCC) 198.3 C79.31     HISTORY OF PRESENT ILLNESS: Julia Luna is a 31 y.o. female seen at the request of Dr. Alen Blew.  The patient was hospitalized on 10/23/2016 after presenting with symptoms of confusion and altered mental status. According to the family, she had been experiencing headaches and confusion and missed work because of it. Based on these findings, she was evaluated urgently and a CT scan of the brain obtained on 10/23/16 showed multiple hyperattenuating lesion associated with vasogenic edema that is suspicious for malignancy. MRI of the brain was obtained which showed numerous supra and infratentorial masses associated with vasogenic edema without midline shift. These masses are numerous with the largest lesion measuring 2.2 cm in the parietal lobe and a 2.4 cm right frontal lobe. CT scan of the chest, abdomen and pelvis did show evidence of advanced malignancy with multiple scattered pulmonary nodules largest of which was in the right upper lobe measuring 2.2 x 2.2 x 1.7 cm. There is a solid appearing mass was a left adnexal lesion measuring 3.2 x 2.3 cm. There is additional cystic appearing mass in the right adnexa measuring 6.6 x 4.2 cm. Enlarged pelvic adenopathy was also noted. 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  The patient was started on dexamethasone 4 mg x 4 daily on 10/24/16 which helped her symptoms  significantly. Additionally, the patient had a biopsy of ovarian, adrenal, and pulmonary lesions done on the same day. This showed metastatic melanoma. Additional testing is ongoing to determine whether her melanoma is BRAF mutated. She has a follow up with Dr. Alen Blew scheduled for 11/03/16.  The patient presents to radiation oncology today to discuss the role that radiation may play in the treatment of her disease. She is accompanied today by her parents.  On review of systems, the patient has a myriad of neurologic symptoms. According to her father she had an episode while hospitalized where her pupils were fixed and she was nonresponsive for approximately 10 minutes. She reports her headaches are less intense with dexamethasone. She had one episode of projectile vomiting while hospitalized, but none since. She reports dizziness/ataxia. The patient denies difficulty with hand coordination or focal numbness/weakness. She also reports visual deficits, and diminished vision with "shadows." She reports a significantly decreased appetite, because food "doesn't taste good." She feels that she has a film over her tongue preventing her from tasting.  The patient reports pain from her nose and face, and stomach, which she describes simply as "pain" and cannot quantify. She says pain lasts for 2 hours or more, and usually happens a couple of times a day. Her father reports that at times when the patient was in the hospital her face would turn bright red and get very hot, though she was reportedly not exhibiting a fever. He reports this spread to her back and lower extremities. In the hospital they utilized  cool compresses to soothe the patient during these episodes. They reportedly went on for several hours each time.  PREVIOUS RADIATION THERAPY: No  PAST MEDICAL HISTORY:  Past Medical History:  Diagnosis Date  . Brain cancer ([place])   . Brain mass 10/2016  . Fibromyalgia   . Osteoarthritis   . Skin cancer     melanoma      PAST SURGICAL HISTORY: Past Surgical History:  Procedure Laterality Date  . CESAREAN SECTION    . CHOLECYSTECTOMY      FAMILY HISTORY:  Family History  Problem Relation Age of Onset  . Thyroid cancer Mother   . Cardiomyopathy Father     SOCIAL HISTORY:  Social History   Social History  . Marital status: Single    Spouse name: N/A  . Number of children: N/A  . Years of education: N/A   Occupational History  . Not on file.   Social History Main Topics  . Smoking status: Current Every Day Smoker    Packs/day: 0.75    Years: 10.00    Types: Cigarettes  . Smokeless tobacco: Never Used  . Alcohol use Yes     Comment: Socially   . Drug use: No  . Sexual activity: Not on file   Other Topics Concern  . Not on file   Social History Narrative  . No narrative on file    ALLERGIES: Other and Penicillins  MEDICATIONS:  Current Outpatient Prescriptions  Medication Sig Dispense Refill  . butalbital-acetaminophen-caffeine (FIORICET, ESGIC) 50-325-40 MG tablet Take 1 tablet by mouth every 4 (four) hours as needed for headache. 14 tablet 0  . dexamethasone (DECADRON) 4 MG tablet Take 1 tablet (4 mg total) by mouth 4 (four) times daily. 120 tablet 0  . DULoxetine (CYMBALTA) 60 MG capsule Take 1 capsule (60 mg total) by mouth daily. 60 capsule 0  . meloxicam (MOBIC) 15 MG tablet Take 1 tablet (15 mg total) by mouth daily. 14 tablet 0  . methocarbamol (ROBAXIN) 500 MG tablet Take 1 tablet (500 mg total) by mouth every 8 (eight) hours as needed for muscle spasms. 30 tablet 0  . morphine (MSIR) 15 MG tablet Take 1 tablet (15 mg total) by mouth every 6 (six) hours as needed for severe pain. 20 tablet 0  . polyethylene glycol (MIRALAX / GLYCOLAX) packet Take 17 g by mouth daily. 14 each 0   No current facility-administered medications for this encounter.     REVIEW OF SYSTEMS:  On review of systems, the patient reports that she is not doing well overall. She denies  any shortness of breath, cough, unintended weight changes. She denies any bowel or bladder disturbances. She denies any new musculoskeletal or joint aches or pains. A complete review of systems is obtained and is otherwise negative.    PHYSICAL EXAM:  Wt Readings from Last 3 Encounters:  10/29/16 200 lb 9.6 oz (91 kg)  10/25/16 214 lb 4.6 oz (97.2 kg)   Temp Readings from Last 3 Encounters:  10/29/16 98 F (36.7 C) (Oral)  10/28/16 97.7 F (36.5 C) (Oral)  05/17/14 99.6 F (37.6 C) (Oral)   BP Readings from Last 3 Encounters:  10/29/16 109/70  10/28/16 129/76  05/17/14 128/71   Pulse Readings from Last 3 Encounters:  10/29/16 89  10/28/16 87  05/17/14 109   In general this is an ill appearing Caucasian woman in mild distress. She is alert and oriented x4 and appropriate throughout the examination. HEENT reveals that the  patient is normocephalic, atraumatic. She has a full set of dentures. EOMs are intact. PERRLA. Skin is intact without any evidence of gross lesions. Cardiovascular exam reveals a regular rate and rhythm, no clicks rubs or murmurs are auscultated. Chest is clear to auscultation bilaterally. Lymphatic assessment is performed and does not reveal any adenopathy in the cervical, supraclavicular, axillary, or inguinal chains. Abdomen has active bowel sounds in all quadrants and is intact. The abdomen is soft, non tender, non distended. Lower extremities are negative for pretibial pitting edema, deep calf tenderness, cyanosis or clubbing.   KPS = 60  100 - Normal; no complaints; no evidence of disease. 90   - Able to carry on normal activity; minor signs or symptoms of disease. 80   - Normal activity with effort; some signs or symptoms of disease. 41   - Cares for self; unable to carry on normal activity or to do active work. 60   - Requires occasional assistance, but is able to care for most of his personal needs. 50   - Requires considerable assistance and frequent  medical care. 37   - Disabled; requires special care and assistance. 108   - Severely disabled; hospital admission is indicated although death not imminent. 20   - Very sick; hospital admission necessary; active supportive treatment necessary. 10   - Moribund; fatal processes progressing rapidly. 0     - Dead  Karnofsky DA, Abelmann [place], Craver LS and Burchenal JH [phone]) The use of the nitrogen mustards in the palliative treatment of carcinoma: with particular reference to bronchogenic carcinoma Cancer 1 [id number]  LABORATORY DATA:  Lab Results  Component Value Date   WBC 12.7 (H) 10/26/2016   HGB 14.3 10/26/2016   HCT 41.2 10/26/2016   MCV 90.7 10/26/2016   PLT 213 10/26/2016   Lab Results  Component Value Date   NA 135 10/26/2016   K 4.1 10/26/2016   CL 103 10/26/2016   CO2 23 10/26/2016   Lab Results  Component Value Date   ALT 58 (H) 10/26/2016   AST 33 10/26/2016   ALKPHOS 60 10/26/2016   BILITOT 0.6 10/26/2016     RADIOGRAPHY: Ct Head Wo Contrast  Result Date: 10/23/2016 CLINICAL DATA:  Intermittent headache for 1 week with vomiting. EXAM: CT HEAD WITHOUT CONTRAST TECHNIQUE: Contiguous axial images were obtained from the base of the skull through the vertex without intravenous contrast. COMPARISON:  12/07/2004 FINDINGS: Brain: There are multiple apparent hyperattenuating masses along the corticomedullary junctions of both cerebral hemispheres. In the right frontal lobe there is a 19 mm lesion. In the left posteromedial parietal lobe there is an 18 mm lesion. In the right parietal lobe superiorly and medially there is a 2.3 cm lesion. There multiple additional smaller lesions. There is surrounding white matter hypoattenuation consistent with vasogenic edema. There is no midline shift. There is some mass effect reflected by sulcal effacement. The ventricles are normal in overall size and configuration. There is a questionable small lesion in the right cerebellum. There is no  evidence of an infarct. There are no extra-axial masses or abnormal fluid collections. There is no intracranial hemorrhage. Vascular: No hyperdense vessel or unexpected calcification. Skull: Normal. Negative for fracture or focal lesion. Sinuses/Orbits: Globes and orbits unremarkable. Visualized sinuses are essentially clear. Other: None. IMPRESSION: 1. Now multiple relatively hyperattenuating brain masses with associated vasogenic edema consistent with metastatic disease. Followup brain MRI with and without contrast is recommended. Electronically Signed   By: Dedra Skeens.D.  On: 10/23/2016 16:19   Ct Chest W Contrast  Result Date: 10/23/2016 CLINICAL DATA:  Diagnosed with brain mass today. EXAM: CT CHEST, ABDOMEN, AND PELVIS WITH CONTRAST TECHNIQUE: Multidetector CT imaging of the chest, abdomen and pelvis was performed following the standard protocol during bolus administration of intravenous contrast. CONTRAST:  144m ISOVUE-300 IOPAMIDOL (ISOVUE-300) INJECTION 61% COMPARISON:  None. FINDINGS: CT CHEST FINDINGS Cardiovascular: Heart size is normal. No pericardial effusion. Thoracic aorta is normal in caliber. Mediastinum/Nodes: No mass or enlarged lymph nodes seen within the mediastinum, perihilar or axillary regions. Esophagus appears normal. Thyroid gland appears normal. Trachea and central bronchi are unremarkable. Lungs/Pleura: Scattered pulmonary nodules scattered throughout both lungs, at least 10 on the right and at least 7 on the left. Largest pulmonary nodule is within the right upper lobe measuring 2.2 x 2.2 x 1.7 cm (series 40, image 54). Largest nodule on the left is located within the left upper lobe, inferior segment posteriorly, measuring 2 x 1.6 x 1.5 cm (series 4, image 47). There are also small bilateral effusions with adjacent atelectasis. Musculoskeletal: No acute or suspicious osseous finding. Superficial soft tissues are unremarkable. CT ABDOMEN PELVIS FINDINGS Hepatobiliary: Status  post cholecystectomy. No focal mass or lesion within the liver. No bile duct dilatation. Pancreas: Unremarkable. No pancreatic ductal dilatation or surrounding inflammatory changes. Spleen: Normal in size without focal abnormality. Adrenals/Urinary Tract: Bilateral adrenal masses, measuring approximately 2.4 x 2.2 cm on the left and approximately 2 x 1.4 cm on the right. Both kidneys appear normal without mass, stone or hydronephrosis. No ureteral or bladder calculi identified. Bladder appears normal. Stomach/Bowel: Bowel is normal in caliber. No bowel wall thickening or evidence of bowel wall inflammation seen. Appendix appears normal. Stomach appears normal. Vascular/Lymphatic: No vascular abnormality appreciated. Scattered small lymph nodes noted within the bilateral iliac chain regions and retroperitoneum. No pathologically enlarged lymph nodes appreciated within the abdomen or pelvis. There is an enlarged/morphologically abnormal lymph node within the left inguinal region, with central hypodensity suspicious for necrosis, measuring 3.5 x 2.3 cm. Reproductive: Solid-appearing mass within the left adnexal region measures 3.2 x 2.3 cm, likely contiguous with the left ovary. Additional cystic-appearing mass within the right adnexal region measures 6.6 x 4.2 cm, likely contiguous with the right ovary. Small amount of free fluid within the adjacent cul-de-sac. Other: None Musculoskeletal: No acute or suspicious osseous finding. IMPRESSION: 1. Multiple scattered pulmonary nodules within each lung, consistent with metastases. Largest nodule is located within the right upper lobe measuring 2.2 x 2.2 x 1.7 cm. 2. Small bilateral pleural effusions. 3. Solid-appearing mass within the left adnexal region measuring 3.2 x 2.3 cm. Additional cystic-appearing mass within the right adnexal region measuring 6.6 x 4.2 cm. 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 4. Enlarged/morphologically abnormal lymph  node within the left inguinal region, with central hypodensity suspicious for necrosis, highly suspicious for metastatic lymph node. 5. Bilateral adrenal masses, also suspicious for metastatic disease. Electronically Signed   By: SFranki CabotM.D.   On: 10/23/2016 22:26   Mr BJeri CosAnd Wo Contrast  Result Date: 10/23/2016 CLINICAL DATA:  Headache, confusion, difficulty walking beginning last week. Follow-up brain mass. EXAM: MRI HEAD WITHOUT AND WITH CONTRAST TECHNIQUE: Multiplanar, multiecho pulse sequences of the brain and surrounding structures were obtained without and with intravenous contrast. CONTRAST:  259mMULTIHANCE GADOBENATE DIMEGLUMINE 529 MG/ML IV SOLN COMPARISON:  CT HEAD October 23, 2016 at 1600 hours. FINDINGS: INTRACRANIAL CONTENTS: Greater than 100 supra- and infratentorial  parenchymal rounded enhancing masses with reduced diffusion within the larger lesions, their able T1 shortening and faint susceptibility artifact. Associated vasogenic edema. Largest lesions are 2.2 cm bilateral parietal lobes, 2.4 cm RIGHT frontal lobe. Local mass effect without midline shift. No extra-axial mass or abnormal enhancement. Ventricles and sulci are overall normal for patient's age. No abnormal extra-axial fluid collections. VASCULAR: Normal major intracranial vascular flow voids present at skull base. SKULL AND UPPER CERVICAL SPINE: No abnormal sellar expansion. No suspicious calvarial bone marrow signal. Craniocervical junction maintained. SINUSES/ORBITS: Trace mastoid effusions. The included ocular globes and orbital contents are non-suspicious. OTHER: None. IMPRESSION: Numerous supra- and infratentorial metastasis, with imaging characteristics of melanoma or mildly hemorrhagic metastasis. Local mass effect without midline shift. Acute findings discussed with and reconfirmed by Dr.NATHAN PICKERING on 10/23/2016 at 8:55 p.m. Electronically Signed   By: Elon Alas M.D.   On: 10/23/2016 21:07   Ct  Abdomen Pelvis W Contrast  Result Date: 10/23/2016 CLINICAL DATA:  Diagnosed with brain mass today. EXAM: CT CHEST, ABDOMEN, AND PELVIS WITH CONTRAST TECHNIQUE: Multidetector CT imaging of the chest, abdomen and pelvis was performed following the standard protocol during bolus administration of intravenous contrast. CONTRAST:  145m ISOVUE-300 IOPAMIDOL (ISOVUE-300) INJECTION 61% COMPARISON:  None. FINDINGS: CT CHEST FINDINGS Cardiovascular: Heart size is normal. No pericardial effusion. Thoracic aorta is normal in caliber. Mediastinum/Nodes: No mass or enlarged lymph nodes seen within the mediastinum, perihilar or axillary regions. Esophagus appears normal. Thyroid gland appears normal. Trachea and central bronchi are unremarkable. Lungs/Pleura: Scattered pulmonary nodules scattered throughout both lungs, at least 10 on the right and at least 7 on the left. Largest pulmonary nodule is within the right upper lobe measuring 2.2 x 2.2 x 1.7 cm (series 40, image 54). Largest nodule on the left is located within the left upper lobe, inferior segment posteriorly, measuring 2 x 1.6 x 1.5 cm (series 4, image 47). There are also small bilateral effusions with adjacent atelectasis. Musculoskeletal: No acute or suspicious osseous finding. Superficial soft tissues are unremarkable. CT ABDOMEN PELVIS FINDINGS Hepatobiliary: Status post cholecystectomy. No focal mass or lesion within the liver. No bile duct dilatation. Pancreas: Unremarkable. No pancreatic ductal dilatation or surrounding inflammatory changes. Spleen: Normal in size without focal abnormality. Adrenals/Urinary Tract: Bilateral adrenal masses, measuring approximately 2.4 x 2.2 cm on the left and approximately 2 x 1.4 cm on the right. Both kidneys appear normal without mass, stone or hydronephrosis. No ureteral or bladder calculi identified. Bladder appears normal. Stomach/Bowel: Bowel is normal in caliber. No bowel wall thickening or evidence of bowel wall  inflammation seen. Appendix appears normal. Stomach appears normal. Vascular/Lymphatic: No vascular abnormality appreciated. Scattered small lymph nodes noted within the bilateral iliac chain regions and retroperitoneum. No pathologically enlarged lymph nodes appreciated within the abdomen or pelvis. There is an enlarged/morphologically abnormal lymph node within the left inguinal region, with central hypodensity suspicious for necrosis, measuring 3.5 x 2.3 cm. Reproductive: Solid-appearing mass within the left adnexal region measures 3.2 x 2.3 cm, likely contiguous with the left ovary. Additional cystic-appearing mass within the right adnexal region measures 6.6 x 4.2 cm, likely contiguous with the right ovary. Small amount of free fluid within the adjacent cul-de-sac. Other: None Musculoskeletal: No acute or suspicious osseous finding. IMPRESSION: 1. Multiple scattered pulmonary nodules within each lung, consistent with metastases. Largest nodule is located within the right upper lobe measuring 2.2 x 2.2 x 1.7 cm. 2. Small bilateral pleural effusions. 3. Solid-appearing mass within the left adnexal region  measuring 3.2 x 2.3 cm. Additional cystic-appearing mass within the right adnexal region measuring 6.6 x 4.2 cm. 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 4. Enlarged/morphologically abnormal lymph node within the left inguinal region, with central hypodensity suspicious for necrosis, highly suspicious for metastatic lymph node. 5. Bilateral adrenal masses, also suspicious for metastatic disease. Electronically Signed   By: Bary Richard M.D.   On: 10/23/2016 22:26   US Biopsy  Result Date: 10/24/2016 INDICATION: Left inguinal adenopathy EXAM: ULTRASOUND-GUIDED BIOPSY LEFT INGUINAL LYMPH NODE.  CORE. MEDICATIONS: None. ANESTHESIA/SEDATION: Fentanyl 25 mcg IV; Versed 1 mg IV Moderate Sedation Time:  8 minutes The patient was continuously monitored during the procedure by the  interventional radiology nurse under my direct supervision. FLUOROSCOPY TIME:  Fluoroscopy Time:  minutes  seconds ( mGy). COMPLICATIONS: None immediate. PROCEDURE: Informed written consent was obtained from the patient after a thorough discussion of the procedural risks, benefits and alternatives. All questions were addressed. Maximal Sterile Barrier Technique was utilized including caps, mask, sterile gowns, sterile gloves, sterile drape, hand hygiene and skin antiseptic. A timeout was performed prior to the initiation of the procedure. The left inguinal region was prepped with Chloraprep in a sterile fashion, and a sterile drape was applied covering the operative field. A sterile gown and sterile gloves were used for the procedure. Under sonographic guidance, 3 18 gauge core biopsies were obtained. Final imaging was performed. Patient tolerated the procedure well without complication. Vital sign monitoring by nursing staff during the procedure will continue as patient is in the special procedures unit for post procedure observation. FINDINGS: The images document guide needle placement within the left inguinal lymph node. Post biopsy images demonstrate no hemorrhage. IMPRESSION: Successful ultrasound-guided core biopsy of an enlarged left inguinal lymph node. Electronically Signed   By: Jolaine Click M.D.   On: 10/24/2016 15:42   Dg Abd Portable 1v  Result Date: 10/26/2016 CLINICAL DATA:  Constipation, nausea, vomiting EXAM: PORTABLE ABDOMEN - 1 VIEW COMPARISON:  None. FINDINGS: There is gaseous distention of small bowel and colon. No radio-opaque calculi or other significant radiographic abnormality are seen. IMPRESSION: Gaseous distension of small bowel colon. No definite bowel obstruction. Electronically Signed   By: Elige Ko   On: 10/26/2016 11:52      IMPRESSION/PLAN: 1. 31 y.o. woman with Melanoma with innumerable brain metastases.  Today, I talked to the patient and family about the findings  and work-up thus far.  We discussed the natural history of Melanoma and general treatment, highlighting the role of radiotherapy in the management.  We discussed the available radiation techniques, and focused on the details of logistics and delivery.  We reviewed the anticipated acute and late sequelae associated with radiation in this setting.  We also reviewed the potential use of Immunotherapy and discussed the logistics.  The patient was encouraged to ask questions that I answered to the best of my ability. This patient is a good candidate for 2 weeks of whole brain radiation therapy.  The patient would like to proceed with radiation and will be scheduled for CT simulation.  I spent 60 minutes face to face with the patient and more than 50% of that time was spent in counseling and/or coordination of care.   ------------------------------------------------ Marcello Fennel, PA-C,  and   Margaretmary Dys, MD [facility]  Radiation Oncology Medical Director and Director of Stereotactic Radiosurgery Direct Dial: [phone]  Fax: [phone] [email or web address]  Skype  [email or web address]  This document serves as a  record of services personally performed by Tyler Pita, MD and Freeman Caldron, PA-C. It was created on their behalf by Maryla Morrow, a trained medical scribe. The creation of this record is based on the scribe's personal observations and the provider's statements to them. This document has been checked and approved by the attending provider.

## 2016-10-29 NOTE — Progress Notes (Signed)
Location/Histology of Brain Tumor: melanoma  Patient was hospitalized on 10/23/16 with symptoms of confusion and altered mental status.  Past or anticipated interventions, if any, per neurosurgery: no  Past or anticipated interventions, if any, per medical oncology: In house consult. Julia Luna note detail that the patient will require systemic therapy which will be determined by BRAF status. Immunotherapy is a possibility in the form of combo Ipi/Nivo. Follow up with Shadad scheduled for 11/03/16  Dose of Decadron, if applicable: decadron 4 mg four times per day  Recent neurologic symptoms, if any:   Seizures: while hospitalized patient had an episode where her pupils were fixed and she was non responsive for approximately 10 minutes per her father  Headaches: less intense with dex  Nausea: one episode of projectile vomiting while hospitalized but, none since  Dizziness/ataxia: yes  Difficulty with hand coordination: no  Focal numbness/weakness: no  Visual deficits/changes: yes, diminished vision with "shadows"  Confusion/Memory deficits: less confused with dex  Painful bone metastases at present, if any: no  SAFETY ISSUES:  Prior radiation? no  Pacemaker/ICD? no  Possible current pregnancy? no  Is the patient on methotrexate? no  Additional Complaints / other details: 31 year old female. Single. Young son lives in [place]. Has multiple masses and lesions noted in both ovaries , adrenal glands and lymphadenopathy, pulmonary lesions and brain mets. Has full set of dentures. Informed SIM staff, Julia Luna, RT of dentures. 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

## 2016-10-29 NOTE — Progress Notes (Signed)
  Radiation Oncology         [phone] ________________________________  Name: Julia Luna MRN: [id number]  Date: 10/29/2016  DOB: March 17, 1986  SIMULATION AND TREATMENT PLANNING NOTE    ICD-9-CM ICD-10-CM   1. Malignant neoplasm metastatic to lung, unspecified laterality ([place]) 197.0 C78.00   2. 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.  Identity was confirmed.  All relevant records and images related to the planned course of therapy were reviewed.  The patient freely provided informed written consent to proceed with treatment after reviewing the details related to the planned course of therapy. The consent form was witnessed and verified by the simulation staff.  Then, the patient was set-up in a stable reproducible  supine position for radiation therapy.  CT images were obtained.  Surface markings were placed.  The CT images were loaded into the planning software.  Then the target and avoidance structures were contoured.  Treatment planning then occurred.  The radiation prescription was entered and confirmed.  Then, I designed and supervised the construction of a total of 3 medically necessary complex treatment devices, including a custom made thermoplastic mask used for immobilization and two complex multileaf collimators to cover the entire intracranial contents, while shielding the eyes and face.  Each [facility] is independently created to account for beam divergence.  The right and left lateral fields will be treated with 6 MV X-rays.  I have requested : Isodose Plan.    PLAN:  The whole brain will be treated to 30 Gy in 10 fractions.  ________________________________  Sheral Apley Tammi Klippel, M.D.

## 2016-10-30 ENCOUNTER — Telehealth: Payer: Self-pay | Admitting: Oncology

## 2016-10-30 ENCOUNTER — Ambulatory Visit
Admission: RE | Admit: 2016-10-30 | Discharge: 2016-10-30 | Disposition: A | Discharge status: Home / Self Care | Payer: Medicaid Other | Source: Ambulatory Visit | Attending: Radiation Oncology | Admitting: Radiation Oncology

## 2016-10-30 DIAGNOSIS — C7800 Secondary malignant neoplasm of unspecified lung: Secondary | ICD-10-CM

## 2016-10-30 DIAGNOSIS — C7931 Secondary malignant neoplasm of brain: Secondary | ICD-10-CM

## 2016-10-30 DIAGNOSIS — C439 Malignant melanoma of skin, unspecified: Secondary | ICD-10-CM

## 2016-10-30 DIAGNOSIS — Z510 Encounter for antineoplastic radiation therapy: Secondary | ICD-10-CM | POA: Diagnosis not present

## 2016-10-30 DIAGNOSIS — C799 Secondary malignant neoplasm of unspecified site: Secondary | ICD-10-CM

## 2016-10-30 LAB — CULTURE, BLOOD (ROUTINE X 2)
CULTURE: NO GROWTH
Culture: NO GROWTH

## 2016-10-30 NOTE — Telephone Encounter (Signed)
Confirmed 1/29 appt date/time with pt per LOS

## 2016-10-31 ENCOUNTER — Ambulatory Visit
Admission: RE | Admit: 2016-10-31 | Discharge: 2016-10-31 | Disposition: A | Discharge status: Home / Self Care | Payer: Medicaid Other | Source: Ambulatory Visit | Attending: Radiation Oncology | Admitting: Radiation Oncology

## 2016-10-31 ENCOUNTER — Telehealth: Payer: Self-pay

## 2016-10-31 ENCOUNTER — Inpatient Hospital Stay: Admission: RE | Admit: 2016-10-31 | Payer: Self-pay | Source: Ambulatory Visit | Admitting: Radiation Oncology

## 2016-10-31 DIAGNOSIS — C799 Secondary malignant neoplasm of unspecified site: Secondary | ICD-10-CM

## 2016-10-31 DIAGNOSIS — C7931 Secondary malignant neoplasm of brain: Secondary | ICD-10-CM

## 2016-10-31 DIAGNOSIS — C439 Malignant melanoma of skin, unspecified: Secondary | ICD-10-CM

## 2016-10-31 DIAGNOSIS — Z510 Encounter for antineoplastic radiation therapy: Secondary | ICD-10-CM | POA: Diagnosis not present

## 2016-10-31 MED ORDER — BIAFINE EX EMUL
Freq: Every day | CUTANEOUS | Status: DC
  Administered 2016-10-31: 16:00:00 via TOPICAL

## 2016-10-31 MED ORDER — EMOLLIENT BASE EX CREA: TOPICAL_CREAM | CUTANEOUS | 0 refills | Status: DC | PRN

## 2016-10-31 NOTE — Telephone Encounter (Signed)
This Case Manager received communication from Laurena Slimmer, RN CM that patient needing a hospital follow-up appointment. Patient uninsured and does not have a PCP. Call placed to patient at [phone] to schedule appointment. Unable to reach patient; HIPPA compliant voicemail left requesting return call.

## 2016-11-03 ENCOUNTER — Ambulatory Visit (HOSPITAL_BASED_OUTPATIENT_CLINIC_OR_DEPARTMENT_OTHER): Payer: Self-pay | Admitting: Oncology

## 2016-11-03 ENCOUNTER — Ambulatory Visit
Admission: RE | Admit: 2016-11-03 | Discharge: 2016-11-03 | Disposition: A | Discharge status: Home / Self Care | Payer: Medicaid Other | Source: Ambulatory Visit | Attending: Radiation Oncology | Admitting: Radiation Oncology

## 2016-11-03 ENCOUNTER — Telehealth: Payer: Self-pay | Admitting: Oncology

## 2016-11-03 DIAGNOSIS — C7960 Secondary malignant neoplasm of unspecified ovary: Secondary | ICD-10-CM

## 2016-11-03 DIAGNOSIS — F411 Generalized anxiety disorder: Secondary | ICD-10-CM

## 2016-11-03 DIAGNOSIS — C7800 Secondary malignant neoplasm of unspecified lung: Secondary | ICD-10-CM

## 2016-11-03 DIAGNOSIS — C439 Malignant melanoma of skin, unspecified: Secondary | ICD-10-CM

## 2016-11-03 DIAGNOSIS — Z7189 Other specified counseling: Secondary | ICD-10-CM | POA: Insufficient documentation

## 2016-11-03 DIAGNOSIS — C779 Secondary and unspecified malignant neoplasm of lymph node, unspecified: Secondary | ICD-10-CM

## 2016-11-03 DIAGNOSIS — Z510 Encounter for antineoplastic radiation therapy: Secondary | ICD-10-CM | POA: Diagnosis not present

## 2016-11-03 DIAGNOSIS — C7949 Secondary malignant neoplasm of other parts of nervous system: Secondary | ICD-10-CM

## 2016-11-03 MED ORDER — LORAZEPAM 1 MG PO TABS: 1.0000 mg | ORAL_TABLET | Freq: Three times a day (TID) | ORAL | 0 refills | Status: DC | PRN

## 2016-11-03 NOTE — Telephone Encounter (Signed)
Appointments scheduled per 1/29 LOS. Patient given AVS report and calendars with future scheduled appointments. °

## 2016-11-03 NOTE — Progress Notes (Signed)
Hematology and Oncology Follow Up Visit  Julia Luna [id number] [birth year] 31 y.o. 11/03/2016 10:24 AM No PCP Per PatientNo ref. provider found   Principle Diagnosis: 31 year old with stage for melanoma diagnosed in January 2018. She presented with brain metastasis, lung lesions and bilateral ovarian masses. This is biopsy-proven to be melanoma of unknown primary. BRAF Status is pending.   Prior Therapy: She is status post lymph node biopsy obtained on 10/24/2016.  Current therapy: Whole brain radiation therapy started on 10/31/2016.  Interim History: Julia Luna presents today for a follow-up visit. She is a 31 year old woman I saw in consultation while she was hospitalized in January 2018. Since her discharge, she has been doing reasonably well. Her appetite is normal and denied any neurological deficits. She has very little to no headaches and denied any confusion, seizure activity. Her ambulation is improving although there is some occasional unsteadiness. She denied any abdominal pain or dyspepsia. She denied any peripheral neuropathy. She did report some anxiousness and anxiety associated with her diagnosis.  She denied any blurred vision, syncope or seizures. She does not report any fevers, chills, sweats or weight loss. She does not report any chest pain, palpitation, orthopnea or leg edema. She does not report any cough, wheezing or hemoptysis. She does not report any nausea, vomiting or abdominal pain. She does not report any frequency urgency or hesitancy. She does not report any skeletal complaints. Remaining review of systems unremarkable.  Medications: I have reviewed the patient's current medications.  Current Outpatient Prescriptions  Medication Sig Dispense Refill  . butalbital-acetaminophen-caffeine (FIORICET, ESGIC) 50-325-40 MG tablet Take 1 tablet by mouth every 4 (four) hours as needed for headache. 14 tablet 0  . dexamethasone (DECADRON) 4 MG tablet Take 1 tablet (4 mg  total) by mouth 4 (four) times daily. 120 tablet 0  . DULoxetine (CYMBALTA) 60 MG capsule Take 1 capsule (60 mg total) by mouth daily. 60 capsule 0  . emollient (BIAFINE) cream Apply topically as needed. 454 g 0  . meloxicam (MOBIC) 15 MG tablet Take 1 tablet (15 mg total) by mouth daily. 14 tablet 0  . methocarbamol (ROBAXIN) 500 MG tablet Take 1 tablet (500 mg total) by mouth every 8 (eight) hours as needed for muscle spasms. 30 tablet 0  . polyethylene glycol (MIRALAX / GLYCOLAX) packet Take 17 g by mouth daily. 14 each 0  . LORazepam (ATIVAN) 1 MG tablet Take 1 tablet (1 mg total) by mouth every 8 (eight) hours as needed for anxiety. 30 tablet 0  . morphine (MSIR) 15 MG tablet Take 1 tablet (15 mg total) by mouth every 6 (six) hours as needed for severe pain. (Patient not taking: Reported on 11/03/2016) 20 tablet 0   No current facility-administered medications for this visit.      Allergies:  Allergies  Allergen Reactions  . Other Hives    NO "-CILLIN(s)"  . Penicillins Hives    Has patient had a PCN reaction causing immediate rash, facial/tongue/throat swelling, SOB or lightheadedness with hypotension: Yes Has patient had a PCN reaction causing severe rash involving mucus membranes or skin necrosis: No Has patient had a PCN reaction that required hospitalization: No Has patient had a PCN reaction occurring within the last 10 years: No If all of the above answers are "NO", then may proceed with Cephalosporin use.     Past Medical History, Surgical history, Social history, and Family History were reviewed and updated.  Physical Exam: Blood pressure (!) 146/86, pulse 95, temperature  31 F (36.7 C), temperature source Oral, resp. rate 18, height _0  (1.626 m), weight 204 lb 8 oz (92.8 kg), SpO2 97 %. ECOG: 1 General appearance: alert and cooperative appeared without distress. Head: Normocephalic, without obvious abnormality no oral ulcers or lesions. Neck: no adenopathy Lymph  nodes: Cervical, supraclavicular, and axillary nodes normal. Heart:regular rate and rhythm, S1, S2 normal, no murmur, click, rub or gallop Lung:chest clear, no wheezing, rales, normal symmetric air entry Abdomin: soft, non-tender, without masses or organomegaly EXT:no erythema, induration, or nodules Neurological examination: No deficits.  Lab Results: Lab Results  Component Value Date   WBC 12.7 (H) 10/26/2016   HGB 14.3 10/26/2016   HCT 41.2 10/26/2016   MCV 90.7 10/26/2016   PLT 213 10/26/2016     Chemistry      Component Value Date/Time   NA 135 10/26/2016 0930   K 4.1 10/26/2016 0930   CL 103 10/26/2016 0930   CO2 23 10/26/2016 0930   BUN 9 10/26/2016 0930   CREATININE 0.75 10/26/2016 0930      Component Value Date/Time   CALCIUM 8.7 (L) 10/26/2016 0930   ALKPHOS 60 10/26/2016 0930   AST 33 10/26/2016 0930   ALT 58 (H) 10/26/2016 0930   BILITOT 0.6 10/26/2016 0930       Impression and Plan:   31 year old woman with the following issues:  1. Metastatic melanoma of unknown primary. She presented with CNS metastasis as well as lung, ovarian and lymph node involvement. This is biopsy-proven to be metastatic melanoma with BRAF status is pending.   Her case was discussed in the melanoma tumor board with imaging studies as well as pathological review were discussed in a multidisciplinary fashion. This discussion was shared with the patient and her parents that are present today. They understand that this is a rather aggressive malignancy and any treatment option is palliative at this time. Given her age and reasonable performance status, aggressive therapy will be warranted.  I outlined the role of systemic therapy which will be the next step in her treating her in the immediate future. Her systemic therapy will depend on her BRAF status. If she is positive, therapy with Dabrafenib (BRAF inhibitor) + Trametinib (MEK inhibitors) the preferred option. After status is  negative, immunotherapy with Nivolumab and Ipilimumab would be the next choice. I anticipate the start of treatment immediately upon completion of radiation therapy. I anticipate her BRAF ref status would be available by the end of the week.  2. 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 She is currently on dexamethasone 4 mg every 6 hours. This will likely be tapered upon completing radiation therapy.  3. Anxiety: Prescription for Ativan was given to patient to help her for the immediate future.  4. Prognosis: Guarded at this time given her aggressive cancer and widespread of her disease. She continues to have excellent performance status and would be an excellent candidate for aggressive therapy.  5. Follow-up: Will be in one week to check on her status.  [facility], MD 1/29/201810:24 AM

## 2016-11-04 ENCOUNTER — Ambulatory Visit
Admission: RE | Admit: 2016-11-04 | Discharge: 2016-11-04 | Disposition: A | Discharge status: Home / Self Care | Payer: Medicaid Other | Source: Ambulatory Visit | Attending: Radiation Oncology | Admitting: Radiation Oncology

## 2016-11-04 DIAGNOSIS — Z510 Encounter for antineoplastic radiation therapy: Secondary | ICD-10-CM | POA: Diagnosis not present

## 2016-11-05 ENCOUNTER — Encounter: Payer: Self-pay | Admitting: Radiation Oncology

## 2016-11-05 ENCOUNTER — Ambulatory Visit
Admission: RE | Admit: 2016-11-05 | Discharge: 2016-11-05 | Disposition: A | Discharge status: Home / Self Care | Payer: Self-pay | Source: Ambulatory Visit | Attending: Radiation Oncology | Admitting: Radiation Oncology

## 2016-11-05 ENCOUNTER — Ambulatory Visit
Admission: RE | Admit: 2016-11-05 | Discharge: 2016-11-05 | Disposition: A | Discharge status: Home / Self Care | Payer: Medicaid Other | Source: Ambulatory Visit | Attending: Radiation Oncology | Admitting: Radiation Oncology

## 2016-11-05 ENCOUNTER — Ambulatory Visit: Payer: Self-pay | Attending: Internal Medicine | Admitting: Physician Assistant

## 2016-11-05 ENCOUNTER — Telehealth: Payer: Self-pay | Admitting: Oncology

## 2016-11-05 ENCOUNTER — Encounter: Payer: Self-pay | Admitting: Oncology

## 2016-11-05 VITALS — BP 125/87 | HR 86 | Temp 97.7°F | Resp 16 | Wt 206.4 lb

## 2016-11-05 VITALS — BP 128/81 | HR 90 | Temp 98.0°F | Resp 18 | Ht 64.0 in | Wt 204.0 lb

## 2016-11-05 DIAGNOSIS — C799 Secondary malignant neoplasm of unspecified site: Secondary | ICD-10-CM | POA: Insufficient documentation

## 2016-11-05 DIAGNOSIS — R4182 Altered mental status, unspecified: Secondary | ICD-10-CM | POA: Insufficient documentation

## 2016-11-05 DIAGNOSIS — M797 Fibromyalgia: Secondary | ICD-10-CM | POA: Insufficient documentation

## 2016-11-05 DIAGNOSIS — Z510 Encounter for antineoplastic radiation therapy: Secondary | ICD-10-CM | POA: Diagnosis not present

## 2016-11-05 DIAGNOSIS — R51 Headache: Secondary | ICD-10-CM | POA: Insufficient documentation

## 2016-11-05 DIAGNOSIS — C7931 Secondary malignant neoplasm of brain: Secondary | ICD-10-CM

## 2016-11-05 DIAGNOSIS — Z880 Allergy status to penicillin: Secondary | ICD-10-CM | POA: Insufficient documentation

## 2016-11-05 DIAGNOSIS — Z79899 Other long term (current) drug therapy: Secondary | ICD-10-CM | POA: Insufficient documentation

## 2016-11-05 DIAGNOSIS — R252 Cramp and spasm: Secondary | ICD-10-CM | POA: Insufficient documentation

## 2016-11-05 DIAGNOSIS — B370 Candidal stomatitis: Secondary | ICD-10-CM

## 2016-11-05 DIAGNOSIS — B379 Candidiasis, unspecified: Secondary | ICD-10-CM | POA: Insufficient documentation

## 2016-11-05 LAB — BASIC METABOLIC PANEL
BUN: 28 mg/dL — ABNORMAL HIGH (ref 7–25)
CALCIUM: 9.2 mg/dL (ref 8.6–10.2)
CO2: 26 mmol/L (ref 20–31)
Chloride: 101 mmol/L (ref 98–110)
Creat: 0.88 mg/dL (ref 0.50–1.10)
GLUCOSE: 133 mg/dL — AB (ref 65–99)
Potassium: 4.8 mmol/L (ref 3.5–5.3)
Sodium: 138 mmol/L (ref 135–146)

## 2016-11-05 MED ORDER — MELOXICAM 15 MG PO TABS: 15.0000 mg | ORAL_TABLET | Freq: Every day | ORAL | 2 refills | Status: DC

## 2016-11-05 MED ORDER — FLUCONAZOLE 200 MG PO TABS: 200.0000 mg | ORAL_TABLET | Freq: Every day | ORAL | 3 refills | Status: DC

## 2016-11-05 MED FILL — FLUCONAZOLE 200 MG TABLET: 200 | 5 days supply | Qty: 5 | Fill #0

## 2016-11-05 MED FILL — MELOXICAM 15 MG TABLET: 15 | 30 days supply | Qty: 30 | Fill #0

## 2016-11-05 NOTE — Telephone Encounter (Signed)
per patient request, mailed FMLA  paperwork to [employer]

## 2016-11-05 NOTE — Progress Notes (Signed)
Pt is in the office today for a hospital follow up Pt states she is not in any pain Pt states she is having cramping in her leg Pt states she would also like to meet with the social worker

## 2016-11-05 NOTE — Addendum Note (Signed)
Addended byZettie Pho S on: 11/05/2016 02:12 PM   Modules accepted: Orders

## 2016-11-05 NOTE — Progress Notes (Signed)
Julia Luna  [id number]  [id number]  DOB - 1985/10/18  Chief Complaint  Patient presents with  . Hospitalization Follow-up       Subjective:   Julia Luna is a 31 y.o. female here today for establishment of care. She has a PMHx of fibromyalgia. The patient was hospitalized on 10/23/2016 after presenting with symptoms of confusion and altered mental status. According to the family, she had been experiencing headaches and confusion and missed work because of it. Based on these findings, she was evaluated urgently and a CT scan of the brain obtained on 10/23/16 showed multiple hyperattenuating lesion associated with vasogenic edema that is suspicious for malignancy. MRI of the brain was obtained which showed numerous supra and infratentorial masses associated with vasogenic edema without midline shift. These masses are numerous with the largest lesion measuring 2.2 cm in the parietal lobe and a 2.4 cm right frontal lobe. CT scan of the chest, abdomen and pelvis did show evidence of advanced malignancy with multiple scattered pulmonary nodules largest of which was in the right upper lobe measuring 2.2 x 2.2 x 1.7 cm. There is a solid appearing mass was a left adnexal lesion measuring 3.2 x 2.3 cm. There is additional cystic appearing mass in the right adnexa measuring 6.6 x 4.2 cm. Enlarged pelvic adenopathy was also noted. 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  The patient was started on dexamethasone 4 mg x 4 daily on 10/24/16 which helped her symptoms significantly. Additionally, the patient had a biopsy of ovarian, adrenal, and pulmonary lesions done on the same day. This showed metastatic melanoma.  Her hospital course was complicated by episodes of unresponsiveness, hot flashes, sinus bradycardia, depression and headaches.  She states that she was just diagnosed with thrush today and given a prescription for  Diflucan. She will rather use our pharmacy. She also has been having some leg cramping. Her appetite is picked up. Her balance has picked up. She has not been driving. She has not been smoking. Not in a great deal pain.  ROS: GEN: denies fever or chills, denies change in weight Skin: no lesions +rash HEENT: denies headache, earache, epistaxis, sore throat, or neck pain +white tongue LUNGS: denies SHOB, dyspnea, PND, orthopnea CV: denies CP or palpitations ABD: denies abd pain, N or V EXT: denies muscle spasms or swelling; no pain in lower ext, no weakness NEURO: denies numbness or tingling, denies sz, stroke or TIA  ALLERGIES: Allergies  Allergen Reactions  . Other Hives    NO "-CILLIN(s)"  . Penicillins Hives    Has patient had a PCN reaction causing immediate rash, facial/tongue/throat swelling, SOB or lightheadedness with hypotension: Yes Has patient had a PCN reaction causing severe rash involving mucus membranes or skin necrosis: No Has patient had a PCN reaction that required hospitalization: No Has patient had a PCN reaction occurring within the last 10 years: No If all of the above answers are "NO", then may proceed with Cephalosporin use.     PAST MEDICAL HISTORY: Past Medical History:  Diagnosis Date  . Brain cancer ([place])   . Brain mass 10/2016  . Fibromyalgia   . Osteoarthritis   . Skin cancer    melanoma    PAST SURGICAL HISTORY: Past Surgical History:  Procedure Laterality Date  . CESAREAN SECTION    . CHOLECYSTECTOMY      MEDICATIONS AT HOME: Prior to Admission medications  Medication Sig Start Date End Date Taking? Authorizing Provider  dexamethasone (DECADRON) 4 MG tablet Take 1 tablet (4 mg total) by mouth 4 (four) times daily. 10/28/16  Yes Lavina Hamman, MD  DULoxetine (CYMBALTA) 60 MG capsule Take 1 capsule (60 mg total) by mouth daily. 10/29/16  Yes Lavina Hamman, MD  butalbital-acetaminophen-caffeine (FIORICET, ESGIC) [phone] MG tablet Take 1  tablet by mouth every 4 (four) hours as needed for headache. Patient not taking: Reported on 11/05/2016 10/28/16   Lavina Hamman, MD  emollient (BIAFINE) cream Apply topically as needed. Patient not taking: Reported on 11/05/2016 10/31/16   Tyler Pita, MD  fluconazole (DIFLUCAN) 200 MG tablet Take 1 tablet (200 mg total) by mouth daily. 11/05/16 11/10/16  Brayton Caves, PA-C  LORazepam (ATIVAN) 1 MG tablet Take 1 tablet (1 mg total) by mouth every 8 (eight) hours as needed for anxiety. Patient not taking: Reported on 11/05/2016 11/03/16   Wyatt Portela, MD  meloxicam (MOBIC) 15 MG tablet Take 1 tablet (15 mg total) by mouth daily. Patient not taking: Reported on 11/05/2016 10/29/16   Lavina Hamman, MD  methocarbamol (ROBAXIN) 500 MG tablet Take 1 tablet (500 mg total) by mouth every 8 (eight) hours as needed for muscle spasms. Patient not taking: Reported on 11/05/2016 10/28/16   Lavina Hamman, MD  morphine (MSIR) 15 MG tablet Take 1 tablet (15 mg total) by mouth every 6 (six) hours as needed for severe pain. Patient not taking: Reported on 11/03/2016 10/28/16   Lavina Hamman, MD  polyethylene glycol [facility] Floria Raveling) packet Take 17 g by mouth daily. Patient not taking: Reported on 11/05/2016 10/28/16   Lavina Hamman, MD     Objective:   Vitals:   11/05/16 1328  BP: 125/87  Pulse: 86  Resp: 16  Temp: 97.7 F (36.5 C)  TempSrc: Oral  SpO2: 97%  Weight: 206 lb 6.4 oz (93.6 kg)    Exam General appearance : Awake, alert, not in any distress. Speech Clear. Not toxic looking. flushed HEENT: Atraumatic and Normocephalic, pupils equally reactive to light and accomodation; yellowish white plaques on tongue Neck: supple, no JVD. No cervical lymphadenopathy.  Chest:Good air entry bilaterally, no added sounds  CVS: S1 S2 regular, no murmurs.  Abdomen: Bowel sounds present, Non tender and not distended with no gaurding, rigidity or rebound. Extremities: B/L Lower Ext shows no edema, both legs  are warm to touch Neurology: Awake alert, and oriented X 3, CN II-XII intact, Non focal Skin: maculopapular rash of face Wounds:N/A   Assessment & Plan  1. Metastatic CA  -Keep upcoming oncology appts  -Keep palliative radiation onc treatment/appts  -prn meds for pain/sleep/anxiety etc  -steroid taper underway  2. Leg cramping  -check K+  3. [place] to our pharmacy  Financial counselor appt Return in about 2 weeks (around 11/19/2016).  The patient was given clear instructions to go to ER or return to medical center if symptoms don't improve, worsen or new problems develop. The patient verbalized understanding. The patient was told to call to get lab results if they haven't heard anything in the next week.   This note has been created with Surveyor, quantity. Any transcriptional errors are unintentional.    Zettie Pho, PA-C [facility] [place], [place]   11/05/2016, 1:58 PM

## 2016-11-05 NOTE — Progress Notes (Unsigned)
Received FMLA paperwork for patient's dad to be completed

## 2016-11-05 NOTE — Progress Notes (Signed)
  Radiation Oncology         [phone]   Name: Julia Luna MRN: [id number]   Date: 11/05/2016  DOB: Aug 06, 1986     Weekly Radiation Therapy Management    ICD-9-CM ICD-10-CM   1. Brain metastases ([facility]) 198.3 C79.31     Current Dose: 15 Gy  Planned Dose:  30 Gy  Narrative The patient presents for routine under treatment assessment.  Weight and vitals stable. Reports cramping pain in her left calf. Reports occasional sharp "headache like" pain that resolves quickly without intervention. Denies nausea, vomiting, or tinnitus. Reports that she continues to see shades in her visual field. Reports difficulty falling asleep, reports ativan has helped. Reports gait had improved but, today she feels more unsteady. Quickly and appropriately responds to all questions asked. Continues dex 4 mg qid. Denies any seizure activity. Reports her appetite is great. She denies acid reflux. She was having vaginal discharge, however this is decreased. She denies vaginal itching or burning.  Set-up films were reviewed. The chart was checked.  Physical Findings  height is 5\' 4"  (1.626 m) and weight is 204 lb (92.5 kg). Her oral temperature is 98 F (36.7 C). Her blood pressure is 128/81 and her pulse is 90. Her respiration is 18 and oxygen saturation is 94%. . Weight essentially stable.  Minimal thrush noted in right buccal mucosa. On calf exam, no palpable cord and negative Homan's sign noted.  Impression The patient is tolerating radiation.  Plan Continue treatment as planned. I have written a diflucan 200 mg prescription for thrush today. She will begin steroid taper to 4 mg TID - may consider 4 mg BID on completion    Ehtan Delfavero A. Tammi Klippel, M.D.  This document serves as a record of services personally performed by Tyler Pita, MD and Freeman Caldron, PAC. It was created on their behalf by Arlyce Harman, a trained medical scribe. The creation of this record is based on the scribe's personal observations  and the provider's statements to them. This document has been checked and approved by the attending provider.

## 2016-11-05 NOTE — Progress Notes (Signed)
Weight and vitals stable. Reports cramping pain in her left calf. Reports occasional sharp "headache like" pain that resolves quickly without intervention. Denies nausea, vomiting, or tinnitus. Reports that she continues to see shades in her visual field. Reports difficulty falling asleep report ativan has helped. Reports gait had improved but, today she feels more unsteady. Quickly and appropriately responds to all questions asked. Continues dex 4 mg qid. Thrush noted. Denies any seizure activity. Reports her appetite is great.   BP 128/81   Pulse 90   Temp 98 F (36.7 C) (Oral)   Resp 18   Ht 5\' 4"  (1.626 m)   Wt 204 lb (92.5 kg)   SpO2 94%   BMI 35.02 kg/m  Wt Readings from Last 3 Encounters:  11/05/16 204 lb (92.5 kg)  11/03/16 204 lb 8 oz (92.8 kg)  10/29/16 200 lb 9.6 oz (91 kg)

## 2016-11-06 ENCOUNTER — Ambulatory Visit
Admission: RE | Admit: 2016-11-06 | Discharge: 2016-11-06 | Disposition: A | Discharge status: Home / Self Care | Payer: Medicaid Other | Source: Ambulatory Visit | Attending: Radiation Oncology | Admitting: Radiation Oncology

## 2016-11-06 ENCOUNTER — Encounter (HOSPITAL_COMMUNITY): Payer: Self-pay

## 2016-11-06 DIAGNOSIS — Z510 Encounter for antineoplastic radiation therapy: Secondary | ICD-10-CM | POA: Diagnosis not present

## 2016-11-06 DIAGNOSIS — Z79899 Other long term (current) drug therapy: Secondary | ICD-10-CM | POA: Diagnosis not present

## 2016-11-06 DIAGNOSIS — Z880 Allergy status to penicillin: Secondary | ICD-10-CM | POA: Diagnosis not present

## 2016-11-06 DIAGNOSIS — C439 Malignant melanoma of skin, unspecified: Secondary | ICD-10-CM | POA: Diagnosis not present

## 2016-11-06 DIAGNOSIS — C799 Secondary malignant neoplasm of unspecified site: Secondary | ICD-10-CM | POA: Diagnosis not present

## 2016-11-06 DIAGNOSIS — C7949 Secondary malignant neoplasm of other parts of nervous system: Secondary | ICD-10-CM | POA: Diagnosis not present

## 2016-11-06 DIAGNOSIS — C7931 Secondary malignant neoplasm of brain: Secondary | ICD-10-CM | POA: Diagnosis not present

## 2016-11-06 DIAGNOSIS — C7800 Secondary malignant neoplasm of unspecified lung: Secondary | ICD-10-CM | POA: Diagnosis not present

## 2016-11-06 DIAGNOSIS — F17210 Nicotine dependence, cigarettes, uncomplicated: Secondary | ICD-10-CM | POA: Diagnosis not present

## 2016-11-07 ENCOUNTER — Ambulatory Visit
Admission: RE | Admit: 2016-11-07 | Discharge: 2016-11-07 | Disposition: A | Discharge status: Home / Self Care | Payer: Medicaid Other | Source: Ambulatory Visit | Attending: Radiation Oncology | Admitting: Radiation Oncology

## 2016-11-07 DIAGNOSIS — Z510 Encounter for antineoplastic radiation therapy: Secondary | ICD-10-CM | POA: Diagnosis not present

## 2016-11-10 ENCOUNTER — Ambulatory Visit (HOSPITAL_BASED_OUTPATIENT_CLINIC_OR_DEPARTMENT_OTHER): Payer: Self-pay | Admitting: Oncology

## 2016-11-10 ENCOUNTER — Ambulatory Visit
Admission: RE | Admit: 2016-11-10 | Discharge: 2016-11-10 | Disposition: A | Discharge status: Home / Self Care | Payer: Medicaid Other | Source: Ambulatory Visit | Attending: Radiation Oncology | Admitting: Radiation Oncology

## 2016-11-10 ENCOUNTER — Telehealth: Payer: Self-pay | Admitting: Pharmacist

## 2016-11-10 ENCOUNTER — Encounter: Payer: Self-pay | Admitting: *Deleted

## 2016-11-10 ENCOUNTER — Ambulatory Visit
Admission: RE | Admit: 2016-11-10 | Discharge: 2016-11-10 | Disposition: A | Discharge status: Home / Self Care | Payer: Self-pay | Source: Ambulatory Visit | Attending: Radiation Oncology | Admitting: Radiation Oncology

## 2016-11-10 ENCOUNTER — Telehealth: Payer: Self-pay | Admitting: Oncology

## 2016-11-10 VITALS — BP 115/87 | HR 106 | Temp 97.6°F | Resp 16 | Wt 207.0 lb

## 2016-11-10 VITALS — BP 114/72 | HR 109 | Temp 98.0°F | Resp 18 | Ht 64.0 in | Wt 207.5 lb

## 2016-11-10 DIAGNOSIS — Z510 Encounter for antineoplastic radiation therapy: Secondary | ICD-10-CM | POA: Diagnosis not present

## 2016-11-10 DIAGNOSIS — C7931 Secondary malignant neoplasm of brain: Secondary | ICD-10-CM

## 2016-11-10 DIAGNOSIS — C439 Malignant melanoma of skin, unspecified: Secondary | ICD-10-CM

## 2016-11-10 DIAGNOSIS — C799 Secondary malignant neoplasm of unspecified site: Secondary | ICD-10-CM

## 2016-11-10 DIAGNOSIS — F411 Generalized anxiety disorder: Secondary | ICD-10-CM

## 2016-11-10 DIAGNOSIS — C7962 Secondary malignant neoplasm of left ovary: Secondary | ICD-10-CM

## 2016-11-10 DIAGNOSIS — C7961 Secondary malignant neoplasm of right ovary: Secondary | ICD-10-CM

## 2016-11-10 DIAGNOSIS — C7949 Secondary malignant neoplasm of other parts of nervous system: Secondary | ICD-10-CM

## 2016-11-10 MED ORDER — DABRAFENIB MESYLATE 75 MG PO CAPS: 150.0000 mg | ORAL_CAPSULE | Freq: Two times a day (BID) | ORAL | 0 refills | Status: DC

## 2016-11-10 MED ORDER — TRAMETINIB DIMETHYL SULFOXIDE 2 MG PO TABS: 2.0000 mg | ORAL_TABLET | Freq: Every day | ORAL | 0 refills | Status: DC

## 2016-11-10 NOTE — Progress Notes (Signed)
Per Dr. Johny Shears order patient is to taper decadron 4 mg from tid to bid following final radiation treatment on Wednesday, February 7th.

## 2016-11-10 NOTE — Progress Notes (Signed)
Weight and vitals stable. Denies pain. Learned this morning her test results prove she is BRAF +. She will begin taking Tafinlar and Mekinist. Also, patient and her mother met with Polo Riley, social worker this morning. Continues decadron 4 mg tid. Completed round of diflucan and no thrush noted. Reports continued joint pain. Reports her left ankle frequency gives way. Denies headaches, dizziness, nausea, vomiting, diplopia or tinnitus. Responds quickly and appropriately to all questions asked. One month follow up appointment card given.   BP 115/87 (BP Location: Right Arm, Patient Position: Sitting, Cuff Size: Large)   Pulse (!) 106   Temp 97.6 F (36.4 C) (Oral)   Resp 16   Wt 207 lb (93.9 kg)   SpO2 100%   BMI 35.53 kg/m  Wt Readings from Last 3 Encounters:  11/10/16 207 lb (93.9 kg)  11/10/16 207 lb 8 oz (94.1 kg)  11/05/16 204 lb (92.5 kg)

## 2016-11-10 NOTE — Progress Notes (Signed)
Hematology and Oncology Follow Up Visit  Julia Luna [id number] 05/08/1986 30 y.o. 11/10/2016 10:26 AM No PCP Per PatientNo ref. provider found   Principle Diagnosis: 30-year-old with stage for melanoma diagnosed in January 2018. She presented with brain metastasis, lung lesions and bilateral ovarian masses. This is biopsy-proven to be melanoma of unknown primary. BRAF Status is positive.   Prior Therapy: She is status post lymph node biopsy obtained on 10/24/2016.  Current therapy:  Whole brain radiation therapy started on 10/31/2016. Anticipated completion is around 11/12/2016. She is under evaluation to start Dabrafinib 150 mg total dose twice a day with Trametinib 2 mg daily.  Interim History: Julia Luna presents today for a follow-up visit. Since the last visit, she reports no major changes in her health. Her appetite met surprisingly excellent on dexamethasone. She denied any new neurological deficits including headaches, blurry vision or seizures. Her ambulation is improving although there is some occasional unsteadiness. She denied any abdominal pain or dyspepsia. She denied any peripheral neuropathy. She denied any excessive fatigue or tiredness.  She denied any falls or syncope. She does not report any fevers, chills, sweats or weight loss. She does not report any chest pain, palpitation, orthopnea or leg edema. She does not report any cough, wheezing or hemoptysis. She does not report any nausea, vomiting or abdominal pain. She does not report any frequency urgency or hesitancy. She does not report any skeletal complaints. Remaining review of systems unremarkable.  Medications: I have reviewed the patient's current medications.  Current Outpatient Prescriptions  Medication Sig Dispense Refill  . butalbital-acetaminophen-caffeine (FIORICET, ESGIC) 50-325-40 MG tablet Take 1 tablet by mouth every 4 (four) hours as needed for headache. 14 tablet 0  . dabrafenib mesylate (TAFINLAR)  75 MG capsule Take 2 capsules (150 mg total) by mouth 2 (two) times daily. Take on an empty stomach 1 hour before or 2 hours after meals. 120 capsule 0  . dexamethasone (DECADRON) 4 MG tablet Take 1 tablet (4 mg total) by mouth 4 (four) times daily. 120 tablet 0  . DULoxetine (CYMBALTA) 60 MG capsule Take 1 capsule (60 mg total) by mouth daily. 60 capsule 0  . emollient (BIAFINE) cream Apply topically as needed. 454 g 0  . fluconazole (DIFLUCAN) 200 MG tablet Take 1 tablet (200 mg total) by mouth daily. 5 tablet 3  . LORazepam (ATIVAN) 1 MG tablet Take 1 tablet (1 mg total) by mouth every 8 (eight) hours as needed for anxiety. 30 tablet 0  . meloxicam (MOBIC) 15 MG tablet Take 1 tablet (15 mg total) by mouth daily. 30 tablet 2  . methocarbamol (ROBAXIN) 500 MG tablet Take 1 tablet (500 mg total) by mouth every 8 (eight) hours as needed for muscle spasms. 30 tablet 0  . morphine (MSIR) 15 MG tablet Take 1 tablet (15 mg total) by mouth every 6 (six) hours as needed for severe pain. 20 tablet 0  . polyethylene glycol (MIRALAX / GLYCOLAX) packet Take 17 g by mouth daily. 14 each 0  . trametinib dimethyl sulfoxide (MEKINIST) 2 MG tablet Take 1 tablet (2 mg total) by mouth daily. Take 1 hour before or 2 hours after meals. Store refrigerated in original container. 30 tablet 0   No current facility-administered medications for this visit.      Allergies:  Allergies  Allergen Reactions  . Other Hives    NO "-CILLIN(s)"  . Penicillins Hives    Has patient had a PCN reaction causing immediate rash, facial/tongue/throat swelling,   SOB or lightheadedness with hypotension: Yes Has patient had a PCN reaction causing severe rash involving mucus membranes or skin necrosis: No Has patient had a PCN reaction that required hospitalization: No Has patient had a PCN reaction occurring within the last 10 years: No If all of the above answers are "NO", then may proceed with Cephalosporin use.     Past Medical  History, Surgical history, Social history, and Family History were reviewed and updated.  Physical Exam: Blood pressure 114/72, pulse (!) 109, temperature 98 F (36.7 C), temperature source Oral, resp. rate 18, height 5' 4" (1.626 m), weight 207 lb 8 oz (94.1 kg), SpO2 98 %. ECOG: 1 General appearance: Well-appearing woman appeared without distress. Head: Normocephalic, without obvious abnormality no oral thrush noted. Neck: no adenopathy Lymph nodes: Cervical, supraclavicular, and axillary nodes normal. Heart:regular rate and rhythm, S1, S2 normal, no murmur, click, rub or gallop Lung:chest clear, no wheezing, rales, normal symmetric air entry Abdomin: soft, non-tender, without masses or organomegaly shifting dullness or ascites. EXT:no erythema, induration, or nodules Neurological examination: No deficits.  Lab Results: Lab Results  Component Value Date   WBC 12.7 (H) 10/26/2016   HGB 14.3 10/26/2016   HCT 41.2 10/26/2016   MCV 90.7 10/26/2016   PLT 213 10/26/2016     Chemistry      Component Value Date/Time   NA 138 11/05/2016 1408   K 4.8 11/05/2016 1408   CL 101 11/05/2016 1408   CO2 26 11/05/2016 1408   BUN 28 (H) 11/05/2016 1408   CREATININE 0.88 11/05/2016 1408      Component Value Date/Time   CALCIUM 9.2 11/05/2016 1408   ALKPHOS 60 10/26/2016 0930   AST 33 10/26/2016 0930   ALT 58 (H) 10/26/2016 0930   BILITOT 0.6 10/26/2016 0930       Impression and Plan:   30-year-old woman with the following issues:  1. Metastatic melanoma of unknown primary. She presented with CNS metastasis as well as lung, ovarian and lymph node involvement. This is biopsy-proven to be metastatic melanoma with BRAF status is Positive.  Risks and benefits of systemic therapy utilizing Dabrafenib (BRAF inhibitor) + Trametinib (MEK inhibitors) was reviewed today. Written information was given to the patient and her family. Complications associated with this combination therapy which  include nausea, fatigue, fever and secondary skin cancers. Alternative therapy would include combined immunotherapy utilizing IPI/Nivo. This combination therapy will be utilized once her disease status is under better control.  She is agreeable to proceed with this therapy and I believe this is the right choice at this time. Given the symptomatic bulky disease quick response to this therapy would be clinically beneficial.   2. 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 She is currently on dexamethasone 4 mg every 8 hours. Currently undergoing tapering under the direction of Dr. Manning.  3. Anxiety: Prescription for Ativan was given to patient To her symptoms at this time.  4. Prognosis: The goals of therapy were reviewed today which include palliative at this time. Despite her age and reasonable functional status, she does have an incurable malignancy at this time.  5. Follow-up: Will be in the next 2 weeks to follow-up on her progress.  SHADAD,FIRAS, MD 2/5/201810:26 AM 

## 2016-11-10 NOTE — Telephone Encounter (Signed)
Appointments scheduled per 2/5 LOS. Patient given AVS report and calendars with future scheduled appointments. °

## 2016-11-10 NOTE — Progress Notes (Signed)
[place] Work  Clinical Social Work was referred by radiation oncology for assessment of psychosocial needs.  Clinical Social Worker met with patient and patient's mother, Julia Luna, to offer support and assess for needs.  The patient shared her main concern at this time was finances and paying for treatment costs.  Patient is currently uninsured- made referral to [employer], financial advocate Marguarite Arbour Hodge's is currently away from office) for assistance with inhibitors and prescription medications.   CSW made referral to [facility] for disability assistance.  CSW and patient contacted [employer] and [employer] for transportation financial assistance.  CSW explored patient's emotional response to cancer experience.  Patient shared minimal feelings, but agreed to follow up with CSW as needed.  CSW provided information on [employer], support programs, and free massages.     Polo Riley, MSW, LCSW, OSW-C Clinical Social Worker [facility] [phone]

## 2016-11-10 NOTE — Progress Notes (Signed)
  Radiation Oncology         [id number]   Name: Julia Luna MRN: [id number]   Date: 11/10/2016  DOB: [birth year]     Weekly Radiation Therapy Management    ICD-9-CM ICD-10-CM   1. Brain metastases (HCC) 198.3 C79.31     Current Dose: 21 Gy  Planned Dose:  30 Gy  Narrative The patient presents for routine under treatment assessment.  Weight and vitals stable. Patient denies pain. She learned this morning her test results prove she is BRAF+. She will begin taking Tafinlar and Mekinist. She met with social work this morning. She continues on Decadron 4 mg tid. She completed a round of Diflucan with no thrush noted. Reports continued joint pain. Reports her left ankle frequently gives way. She denies headaches, dizziness, nausea, diplopia, or tinnitus.   Set-up films were reviewed. The chart was checked.  Physical Findings  weight is 207 lb (93.9 kg). Her oral temperature is 97.6 F (36.4 C). Her blood pressure is 115/87 and her pulse is 106 (abnormal). Her respiration is 16 and oxygen saturation is 100%. . Weight essentially stable.  Impression The patient is tolerating radiation.  Plan Continue treatment as planned. Decreased Decadron from 4 mg tid to 4 mg bid after completion of radiation. A one month follow up card was given.    Sheral Apley Tammi Klippel, M.D.  This document serves as a record of services personally performed by Tyler Pita, MD. It was created on his behalf by Bethann Humble, a trained medical scribe. The creation of this record is based on the scribe's personal observations and the provider's statements to them. This document has been checked and approved by the attending provider.

## 2016-11-10 NOTE — Telephone Encounter (Signed)
Oral Chemotherapy Pharmacist Encounter  Received new prescription for Tafinlar and Mekinist for BRAF positive metastatic melanoma that is recently diagnosed. CMET and CBC from 10/26/16 reviewed, OK for treatment  Current medication list in Epic assessed, DDIs with chosen therapy identified  Tafinlar and dexamethasone: Category D interaction: Tafinlar is a CYP3A4 inducer and may decrease serum concentration of dexamethasone. Dexamethasone is currently being used due to brain metastasis and dose may be decreased in the future. No change to therapy is indicated at this time.  Tafinlar and Mobic: Category D interaction: [place] is a CYP2C9 inducer and may decrease serum concentration of Mobic. No change to therapy indicated at this time.  Baseline ECHO to assess LVEF as well as repeat ECHOs at 1 month then every 2-3 months while on therapy is recommended by the manufacturer. No baseline ECHO has been performed. This will be discussed with MD.  Noted patient does not have insurance coverage. I confirmed this with [facility] (patient gets her medications filled at this pharmacy, however all info on her file is discount cards and no prescription insurance coverage). I will start application for [employer] patient assistance program. Case discussed with financial advocates Lenise White and [employer]. Jenny Reichmann has already LVM for patient to bring in financial docs for XRT assistance. This same information will be used for [employer] application.  Prescription will not be sent to pharmacy at this time. I will follow up with [facility] tomorrow (2/6) for completion of manufacturer assistance application.  Oral Oncology Clinic will continue to follow.  [name], PharmD, BCPS, BCOP 11/10/2016  1:51 PM Oral Oncology Clinic [phone]

## 2016-11-11 ENCOUNTER — Ambulatory Visit
Admission: RE | Admit: 2016-11-11 | Discharge: 2016-11-11 | Disposition: A | Discharge status: Home / Self Care | Payer: Medicaid Other | Source: Ambulatory Visit | Attending: Radiation Oncology | Admitting: Radiation Oncology

## 2016-11-11 DIAGNOSIS — Z510 Encounter for antineoplastic radiation therapy: Secondary | ICD-10-CM | POA: Diagnosis not present

## 2016-11-12 ENCOUNTER — Encounter: Payer: Self-pay | Admitting: Pharmacist

## 2016-11-12 ENCOUNTER — Telehealth: Payer: Self-pay | Admitting: *Deleted

## 2016-11-12 ENCOUNTER — Telehealth: Payer: Self-pay | Admitting: Radiation Oncology

## 2016-11-12 ENCOUNTER — Encounter: Payer: Self-pay | Admitting: Radiation Oncology

## 2016-11-12 ENCOUNTER — Ambulatory Visit
Admission: RE | Admit: 2016-11-12 | Discharge: 2016-11-12 | Disposition: A | Discharge status: Home / Self Care | Payer: Medicaid Other | Source: Ambulatory Visit | Attending: Radiation Oncology | Admitting: Radiation Oncology

## 2016-11-12 ENCOUNTER — Other Ambulatory Visit: Payer: Self-pay | Admitting: *Deleted

## 2016-11-12 ENCOUNTER — Encounter: Payer: Self-pay | Admitting: *Deleted

## 2016-11-12 DIAGNOSIS — Z510 Encounter for antineoplastic radiation therapy: Secondary | ICD-10-CM | POA: Diagnosis not present

## 2016-11-12 MED ORDER — MORPHINE SULFATE 15 MG PO TABS: 15.0000 mg | ORAL_TABLET | Freq: Four times a day (QID) | ORAL | 0 refills | Status: AC | PRN

## 2016-11-12 MED ORDER — MELOXICAM 15 MG PO TABS: 15.0000 mg | ORAL_TABLET | Freq: Every day | ORAL | 0 refills | Status: DC

## 2016-11-12 MED FILL — MORPHINE SULFATE IR 15 MG T: 15 | 15 days supply | Qty: 60 | Fill #0

## 2016-11-12 MED FILL — MELOXICAM 15 MG TABLET: 15 | 60 days supply | Qty: 60 | Fill #0

## 2016-11-12 NOTE — Telephone Encounter (Signed)
After speaking with Shona Simpson, PA-C phoned patient's mother, Quita Skye, to discuss Norco (hydrocodone-acetaminophen) to manage joint pain. No answer. Left message explaining my intentions but, that it appears Dr. Alen Blew has already made recommendation. Advised she follow Dr. Hazeline Junker recommendations but, to feel free to call me back with future needs.

## 2016-11-12 NOTE — Telephone Encounter (Signed)
Mother here to p/u scripts

## 2016-11-12 NOTE — Telephone Encounter (Signed)
"  I'm calling for my daughter.  She has severe joint pain.  She took MSIR last night, slept.  Now today she can hardly walk.  Takes Meloxicam, need to know if she can take ibuprofen.  She hurts so bad she can hardly walk.  Only her medical oncologist can order pain medicines."  Will notify provider of this call.  This nurse advised not to take ibuprofen which is an additional NSAID.  Scheduled for Radiation oncology today at 11:15.

## 2016-11-12 NOTE — Progress Notes (Signed)
Oral Chemotherapy Pharmacist Encounter At request of Polo Riley, LCSW, I met w/ pt and her mother, Edynn Gillock and had pt sign application for [employer] in order to get her [place].  I offered counseling to patient & her mother on administration, dosing, side effects, safe handling, and monitoring but pt declined.  She didn't feel well today.  I have obtained Dr. Hazeline Junker signature and I faxed her application to [employer] today.  I provided pt and her mother our contact information for the Oral Chemo Clinic.  They understand that we are willing to be of any assistance possible and they can reach out to [place] anytime.  Will follow up with [employer] within 1 week if they haven't contacted [place] first.  Thank you, Kennith Center, Pharm.D., CPP 11/12/2016_0 :10 PM Oral Chemotherapy Clinic

## 2016-11-13 NOTE — Progress Notes (Signed)
  Radiation Oncology         [phone] ________________________________  Name: Julia Luna MRN: [id number]  Date: 11/12/2016  DOB: 08/21/1986  End of Treatment Note  Diagnosis:   31 y.o. woman with metastatic melanoma and innumerable brain metastases  Indication for treatment:  Palliation       Radiation treatment dates:   10/30/16 - 11/12/16  Site/dose:   The whole brain was treated to 30 Gy in 10 fractions of 3 Gy  Beams/energy:   Right and Left radiation fields were treated using 6 MV X-rays with custom MLC collimation to shield the eyes and face.  The patient was immobilized with a thermoplastic mask and isocenter was verified with weekly port films.  Narrative: The patient tolerated radiation treatment relatively well. The patient had headaches that resolved quickly without intervention, cramps in her left calf, shades in her visual field, difficulty sleeping, unsteady gait, and vaginal discharge. The patient was on Decadron 4 mg QID to TID.  Plan: The patient has completed radiation treatment. The patient will return to radiation oncology clinic for routine followup in one month. I advised them to call or return sooner if they have any questions or concerns related to their recovery or treatment.  The patient is to decrease Decadron from 4 mg TID to 4 mg BID after completion of radiation. ________________________________  Sheral Apley Tammi Klippel, M.D.  This document serves as a record of services personally performed by Tyler Pita, MD. It was created on his behalf by Darcus Austin, a trained medical scribe. The creation of this record is based on the scribe's personal observations and the provider's statements to them. This document has been checked and approved by the attending provider.

## 2016-11-17 NOTE — Progress Notes (Signed)
Oral Chemotherapy Pharmacist Encounter  Received notification from [place] ([employer]) that patient has been successfully enrolled into their program to receive Tafinlar (dabrafenib) and Mekinist (trametinib) free-of-charge from the manufacturer until 11/12/17  Award letter will be placed to scan into patient's chart.  I called and LVM for patient with good news. I also left phone number to [employer] [phone]) to call with questions about prescription delivery.  I left offer to perform initial counseling on voicemail as well phone number to Oral Oncology Clinic on voicemail.  [name], PharmD, BCPS, BCOP 11/17/2016  4:52 PM Oral Oncology Clinic [phone]

## 2016-11-18 ENCOUNTER — Telehealth: Payer: Self-pay | Admitting: Radiation Oncology

## 2016-11-18 ENCOUNTER — Telehealth: Payer: Self-pay | Admitting: Pharmacist

## 2016-11-18 NOTE — Telephone Encounter (Signed)
That sounds good. Baseline EKG done on 10/23/2016 and should be adequate.  I will arrange for an echo after her next visit on 2/19.

## 2016-11-18 NOTE — Telephone Encounter (Signed)
Oral Chemotherapy Pharmacist Encounter  I spoke with patient's mother, Julia Luna, for overview of new oral chemotherapy medications: Tafinlar and Mekinist.  Patient will be receiving medication from [employer], 1st shipment to arrive tomorrow 11/19/16 and p[lans to start taking the medications as soon as they arrive.  Counseled patient on administration, dosing, side effects, safe handling, and monitoring. Both medications will be taken on an empty stomach, Tafinlar will be taken BID and Mekinist once daily. Grapefruit and grapefruit juice will be avoided.  Side effects include but not limited to: fatigue, N/V/D, skin rash, arthralgia.  Julia Luna voiced understanding and appreciation.  Julia Luna stated patient has pre-existing arthralgia for which she is taking meloxicam and MS IR. I suggested patient can also try acetaminophen if pain persists and to keep the office posted about how much pain medication Julia Luna is requiring.  All questions answered. Julia Luna requested that I stop by MD office visit on Monday 2/19 at [place] for follow-up questions.  They know to call the office with questions or concerns.  Oral Oncology Clinic will continue to follow.  Thank you,  [name], PharmD, BCPS, BCOP 11/18/2016  4:07 PM Oral Oncology Clinic [phone]

## 2016-11-18 NOTE — Telephone Encounter (Signed)
Received message from patient's mother, Quita Skye, that the remainder of her daughter's hair has fallen out and "now she has white heads." Returned Dale's call. Confirmed white heads are small and not boils. Advised that she have Nadira wash her scalp regularly with warm water and Dove soap. Also, encouraged application of hydrocortisone cream 1% at least once daily to her scalp to help irritation/white heads resolve. Quita Skye verbalized understanding and expressed appreciation for the return call.

## 2016-11-19 ENCOUNTER — Ambulatory Visit: Payer: Medicaid Other | Attending: Family Medicine | Admitting: Family Medicine

## 2016-11-19 ENCOUNTER — Encounter: Payer: Self-pay | Admitting: Family Medicine

## 2016-11-19 VITALS — BP 132/83 | HR 122 | Temp 98.2°F | Resp 20 | Wt 213.0 lb

## 2016-11-19 DIAGNOSIS — Z131 Encounter for screening for diabetes mellitus: Secondary | ICD-10-CM | POA: Diagnosis not present

## 2016-11-19 DIAGNOSIS — Z7689 Persons encountering health services in other specified circumstances: Secondary | ICD-10-CM | POA: Insufficient documentation

## 2016-11-19 DIAGNOSIS — Z880 Allergy status to penicillin: Secondary | ICD-10-CM | POA: Insufficient documentation

## 2016-11-19 DIAGNOSIS — C799 Secondary malignant neoplasm of unspecified site: Secondary | ICD-10-CM

## 2016-11-19 DIAGNOSIS — Z923 Personal history of irradiation: Secondary | ICD-10-CM | POA: Diagnosis not present

## 2016-11-19 DIAGNOSIS — Z79899 Other long term (current) drug therapy: Secondary | ICD-10-CM | POA: Diagnosis not present

## 2016-11-19 DIAGNOSIS — C439 Malignant melanoma of skin, unspecified: Secondary | ICD-10-CM

## 2016-11-19 DIAGNOSIS — R000 Tachycardia, unspecified: Secondary | ICD-10-CM | POA: Diagnosis not present

## 2016-11-19 LAB — POCT GLYCOSYLATED HEMOGLOBIN (HGB A1C): Hemoglobin A1C: 5.6

## 2016-11-19 MED ORDER — DULOXETINE HCL 60 MG PO CPEP: 60.0000 mg | ORAL_CAPSULE | Freq: Every day | ORAL | 3 refills | Status: DC

## 2016-11-19 MED FILL — DULoxetine HCL 60 MG CPEP: 60 | 30 days supply | Qty: 30 | Fill #0

## 2016-11-19 NOTE — Progress Notes (Signed)
Subjective:  Patient ID: Julia Luna, female    DOB: 1986-03-06  Age: 31 y.o. MRN: [id number]  CC: Here to establish care  HPI GRAVIELA Luna is a 31 year old female who presents to establish care with the clinic. Medical history is significant for melanoma unknown primary origin with metastasis to the central nervous system, ovary, lymph nodes. She completed radiation and 11/12/16, is currently on Decadron taper and is being evaluated to commence Dabrafenib and Trametinib.  Appointment with oncology - Dr. Alen Blew comes up on 11/25/15.  At her last visit with the PA, she was treated for oral thrush which she states has resolved at this time. She is accompanied by her mom today and does have some dyspnea on mild to moderate exertion but no chest pain, pedal edema, headaches. She is requesting a refill of her Cymbalta otherwise has all of medications.  Past Medical History:  Diagnosis Date  . Brain cancer ([place])   . Brain mass 10/2016  . Fibromyalgia   . Osteoarthritis   . Skin cancer    melanoma    Past Surgical History:  Procedure Laterality Date  . CESAREAN SECTION    . CHOLECYSTECTOMY      Allergies  Allergen Reactions  . Other Hives    NO "-CILLIN(s)"  . Penicillins Hives    Has patient had a PCN reaction causing immediate rash, facial/tongue/throat swelling, SOB or lightheadedness with hypotension: Yes Has patient had a PCN reaction causing severe rash involving mucus membranes or skin necrosis: No Has patient had a PCN reaction that required hospitalization: No Has patient had a PCN reaction occurring within the last 10 years: No If all of the above answers are "NO", then may proceed with Cephalosporin use.      Outpatient Medications Prior to Visit  Medication Sig Dispense Refill  . emollient (BIAFINE) cream Apply topically as needed. 454 g 0  . LORazepam (ATIVAN) 1 MG tablet Take 1 tablet (1 mg total) by mouth every 8 (eight) hours as needed for anxiety. 30  tablet 0  . meloxicam (MOBIC) 15 MG tablet Take 1 tablet (15 mg total) by mouth daily. 60 tablet 0  . methocarbamol (ROBAXIN) 500 MG tablet Take 1 tablet (500 mg total) by mouth every 8 (eight) hours as needed for muscle spasms. 30 tablet 0  . morphine (MSIR) 15 MG tablet Take 1 tablet (15 mg total) by mouth every 6 (six) hours as needed for severe pain. 60 tablet 0  . polyethylene glycol (MIRALAX / GLYCOLAX) packet Take 17 g by mouth daily. 14 each 0  . trametinib dimethyl sulfoxide (MEKINIST) 2 MG tablet Take 1 tablet (2 mg total) by mouth daily. Take 1 hour before or 2 hours after meals. Store refrigerated in original container. 30 tablet 0  . DULoxetine (CYMBALTA) 60 MG capsule Take 1 capsule (60 mg total) by mouth daily. 60 capsule 0  . butalbital-acetaminophen-caffeine (FIORICET, ESGIC) 50-325-40 MG tablet Take 1 tablet by mouth every 4 (four) hours as needed for headache. (Patient not taking: Reported on 11/10/2016) 14 tablet 0  . dabrafenib mesylate (TAFINLAR) 75 MG capsule Take 2 capsules (150 mg total) by mouth 2 (two) times daily. Take on an empty stomach 1 hour before or 2 hours after meals. (Patient not taking: Reported on 11/10/2016) 120 capsule 0  . dexamethasone (DECADRON) 4 MG tablet Take 1 tablet (4 mg total) by mouth 4 (four) times daily. (Patient taking differently: Take 4 mg by mouth 4 (four) times daily. )  120 tablet 0   No facility-administered medications prior to visit.     ROS Review of Systems  Constitutional: Positive for fatigue. Negative for activity change and appetite change.  HENT: Negative for sinus pressure and sore throat.   Respiratory: Positive for shortness of breath. Negative for chest tightness and wheezing.   Cardiovascular: Negative for chest pain and palpitations.  Gastrointestinal: Negative for abdominal distention, abdominal pain and constipation.  Genitourinary: Negative.   Musculoskeletal: Negative.   Psychiatric/Behavioral: Negative for behavioral  problems, confusion and dysphoric mood.    Objective:  BP 132/83 (BP Location: Right Arm, Patient Position: Sitting, Cuff Size: Large)   Pulse (!) 122 Comment: 122 reg. MD aware  Temp 98.2 F (36.8 C) (Oral)   Resp 20   Wt 213 lb (96.6 kg)   SpO2 96%   BMI 36.56 kg/m   BP/Weight 11/19/2016 Q000111Q Q000111Q  Systolic BP Q000111Q [id number]  Diastolic BP 83 87 72  Wt. (Lbs) 213 207 207.5  BMI 36.56 35.53 35.62  Some encounter information is confidential and restricted. Go to Review Flowsheets activity to see all data.      Physical Exam  Constitutional: She is oriented to person, place, and time. She appears well-developed and well-nourished.  Cardiovascular: Normal heart sounds and intact distal pulses.  Tachycardia present.   No murmur heard. Pulmonary/Chest: Effort normal and breath sounds normal. She has no wheezes. She has no rales. She exhibits no tenderness.  Abdominal: Soft. Bowel sounds are normal. She exhibits no distension and no mass. There is no tenderness.  Musculoskeletal: Normal range of motion.  Neurological: She is alert and oriented to person, place, and time.    Lab Results  Component Value Date   HGBA1C 5.6 11/19/2016    Assessment & Plan:   1. Metastatic melanoma ([place]) Completed radiation Currently on Decadron taper Under evaluation to start systemic therapy Scheduled to see Dr. Alen Blew in 5 days.  2. Screening for diabetes mellitus Screen for diabetes due to steroid use A1c is 5.6  3. Tachycardia Sinus tachycardia which is not new If blood pressure allows might add a beta blocker Plan by oncology is to call Dr. a 2-D echo at her next visit.   Meds ordered this encounter  Medications  . DULoxetine (CYMBALTA) 60 MG capsule    Sig: Take 1 capsule (60 mg total) by mouth daily.    Dispense:  30 capsule    Refill:  3    Follow-up: Return in about 6 weeks (around 12/31/2016) for Coordination of care.   [name] MD

## 2016-11-20 ENCOUNTER — Other Ambulatory Visit: Payer: Self-pay | Admitting: Oncology

## 2016-11-20 ENCOUNTER — Encounter: Payer: Self-pay | Admitting: Family Medicine

## 2016-11-21 ENCOUNTER — Other Ambulatory Visit: Payer: Self-pay | Admitting: Oncology

## 2016-11-24 ENCOUNTER — Other Ambulatory Visit: Payer: Self-pay | Admitting: *Deleted

## 2016-11-24 ENCOUNTER — Telehealth: Payer: Self-pay | Admitting: Oncology

## 2016-11-24 ENCOUNTER — Other Ambulatory Visit (HOSPITAL_BASED_OUTPATIENT_CLINIC_OR_DEPARTMENT_OTHER): Payer: Self-pay

## 2016-11-24 ENCOUNTER — Ambulatory Visit (HOSPITAL_BASED_OUTPATIENT_CLINIC_OR_DEPARTMENT_OTHER): Payer: Self-pay | Admitting: Oncology

## 2016-11-24 VITALS — BP 115/77 | HR 110 | Temp 98.1°F | Resp 18 | Ht 64.0 in | Wt 212.0 lb

## 2016-11-24 DIAGNOSIS — C7800 Secondary malignant neoplasm of unspecified lung: Secondary | ICD-10-CM

## 2016-11-24 DIAGNOSIS — C799 Secondary malignant neoplasm of unspecified site: Secondary | ICD-10-CM

## 2016-11-24 DIAGNOSIS — C439 Malignant melanoma of skin, unspecified: Secondary | ICD-10-CM

## 2016-11-24 DIAGNOSIS — C7961 Secondary malignant neoplasm of right ovary: Secondary | ICD-10-CM

## 2016-11-24 DIAGNOSIS — C7931 Secondary malignant neoplasm of brain: Secondary | ICD-10-CM

## 2016-11-24 DIAGNOSIS — C7962 Secondary malignant neoplasm of left ovary: Secondary | ICD-10-CM

## 2016-11-24 LAB — CBC WITH DIFFERENTIAL/PLATELET
BASO%: 0.5 % (ref 0.0–2.0)
Basophils Absolute: 0 10*3/uL (ref 0.0–0.1)
EOS ABS: 0 10*3/uL (ref 0.0–0.5)
EOS%: 0.4 % (ref 0.0–7.0)
HCT: 41.3 % (ref 34.8–46.6)
HEMOGLOBIN: 14.3 g/dL (ref 11.6–15.9)
LYMPH%: 25.9 % (ref 14.0–49.7)
MCH: 32.7 pg (ref 25.1–34.0)
MCHC: 34.6 g/dL (ref 31.5–36.0)
MCV: 94.4 fL (ref 79.5–101.0)
MONO#: 0.4 10*3/uL (ref 0.1–0.9)
MONO%: 10.2 % (ref 0.0–14.0)
NEUT%: 63 % (ref 38.4–76.8)
NEUTROS ABS: 2.6 10*3/uL (ref 1.5–6.5)
Platelets: 115 10*3/uL — ABNORMAL LOW (ref 145–400)
RBC: 4.38 10*6/uL (ref 3.70–5.45)
RDW: 14.9 % — AB (ref 11.2–14.5)
WBC: 4.1 10*3/uL (ref 3.9–10.3)
lymph#: 1.1 10*3/uL (ref 0.9–3.3)

## 2016-11-24 LAB — COMPREHENSIVE METABOLIC PANEL
ALBUMIN: 3.1 g/dL — AB (ref 3.5–5.0)
ALK PHOS: 83 U/L (ref 40–150)
ALT: 82 U/L — AB (ref 0–55)
AST: 25 U/L (ref 5–34)
Anion Gap: 8 mEq/L (ref 3–11)
BILIRUBIN TOTAL: 0.48 mg/dL (ref 0.20–1.20)
BUN: 21.1 mg/dL (ref 7.0–26.0)
CO2: 26 mEq/L (ref 22–29)
Calcium: 9 mg/dL (ref 8.4–10.4)
Chloride: 104 mEq/L (ref 98–109)
Creatinine: 0.7 mg/dL (ref 0.6–1.1)
EGFR: >90 mL/min/{1.73_m2} (ref >90)
GLUCOSE: 93 mg/dL (ref 70–140)
Potassium: 4.8 mEq/L (ref 3.5–5.1)
SODIUM: 139 meq/L (ref 136–145)
TOTAL PROTEIN: 6.2 g/dL — AB (ref 6.4–8.3)

## 2016-11-24 LAB — TECHNOLOGIST REVIEW

## 2016-11-24 MED ORDER — PROCHLORPERAZINE MALEATE 10 MG PO TABS: 10.0000 mg | ORAL_TABLET | Freq: Four times a day (QID) | ORAL | 1 refills | Status: AC | PRN

## 2016-11-24 MED ORDER — DEXAMETHASONE 4 MG PO TABS: 4.0000 mg | ORAL_TABLET | Freq: Four times a day (QID) | ORAL | 0 refills | Status: DC

## 2016-11-24 MED ORDER — LORAZEPAM 1 MG PO TABS: 1.0000 mg | ORAL_TABLET | Freq: Three times a day (TID) | ORAL | 0 refills | Status: DC | PRN

## 2016-11-24 MED FILL — PROCHLORPERAZINE 10 MG TAB: 10 | 7 days supply | Qty: 30 | Fill #0

## 2016-11-24 MED FILL — DEXAMETHASONE 4 MG TABLET: 4 | 30 days supply | Qty: 120 | Fill #0

## 2016-11-24 MED FILL — LORazepam 1 MG TABS: 1 | 10 days supply | Qty: 30 | Fill #0

## 2016-11-24 NOTE — Telephone Encounter (Signed)
Gave patient avs report and appointments for March and echo for 11/25/16.

## 2016-11-24 NOTE — Progress Notes (Signed)
Hematology and Oncology Follow Up Visit  Julia Luna [id number] 06-10-1986 31 y.o. 11/24/2016 9:27 AM Julia Luna, MDNo ref. provider found   Principle Diagnosis: 31 year old with stage for melanoma diagnosed in January 2018. She presented with brain metastasis, lung lesions and bilateral ovarian masses. This is biopsy-proven to be melanoma of unknown primary. BRAF Status is positive.   Prior Therapy: She is status post lymph node biopsy obtained on 10/24/2016.  Current therapy:  Whole brain radiation therapy started on 10/31/2016. Anticipated completion is around 11/12/2016.  Dabrafinib 150 mg total dose twice a day with Trametinib 2 mg daily. Therapy started on 11/19/2016.  Interim History: Julia Luna presents today for a follow-up visit. Since the last visit, she started on oral therapy and have tolerated it well. She denied any nausea, vomiting or fevers. She does report some arthralgias but these are chronic in nature.   Her appetite continues to be excellent on dexamethasone and have gained weight. She denied any new neurological deficits including headaches, blurry vision or seizures. She denied any abdominal pain or dyspepsia. She denied any peripheral neuropathy. She denied any excessive fatigue or tiredness.  She denied any falls or syncope. She does not report any fevers, chills, sweats or weight loss. She does not report any chest pain, palpitation, orthopnea or leg edema. She does not report any cough, wheezing or hemoptysis. She does not report any nausea, vomiting or abdominal pain. She does not report any frequency urgency or hesitancy. She does not report any skeletal complaints. Remaining review of systems unremarkable.  Medications: I have reviewed the patient's current medications.  Current Outpatient Prescriptions  Medication Sig Dispense Refill  . butalbital-acetaminophen-caffeine (FIORICET, ESGIC) 50-325-40 MG tablet Take 1 tablet by mouth every 4 (four) hours as  needed for headache. 14 tablet 0  . dabrafenib mesylate (TAFINLAR) 75 MG capsule Take 2 capsules (150 mg total) by mouth 2 (two) times daily. Take on an empty stomach 1 hour before or 2 hours after meals. 120 capsule 0  . DULoxetine (CYMBALTA) 60 MG capsule Take 1 capsule (60 mg total) by mouth daily. 30 capsule 3  . emollient (BIAFINE) cream Apply topically as needed. 454 g 0  . esomeprazole (NEXIUM) 20 MG capsule Take 20 mg by mouth daily at 12 noon.    [name] LORazepam (ATIVAN) 1 MG tablet Take 1 tablet (1 mg total) by mouth every 8 (eight) hours as needed. for anxiety 30 tablet 0  . meloxicam (MOBIC) 15 MG tablet Take 1 tablet (15 mg total) by mouth daily. 60 tablet 0  . methocarbamol (ROBAXIN) 500 MG tablet Take 1 tablet (500 mg total) by mouth every 8 (eight) hours as needed for muscle spasms. 30 tablet 0  . morphine (MSIR) 15 MG tablet Take 1 tablet (15 mg total) by mouth every 6 (six) hours as needed for severe pain. 60 tablet 0  . polyethylene glycol (MIRALAX / GLYCOLAX) packet Take 17 g by mouth daily. 14 each 0  . prochlorperazine (COMPAZINE) 10 MG tablet Take 1 tablet (10 mg total) by mouth every 6 (six) hours as needed for nausea or vomiting. 30 tablet 1  . trametinib dimethyl sulfoxide (MEKINIST) 2 MG tablet Take 1 tablet (2 mg total) by mouth daily. Take 1 hour before or 2 hours after meals. Store refrigerated in original container. 30 tablet 0  . dexamethasone (DECADRON) 4 MG tablet Take 1 tablet (4 mg total) by mouth 4 (four) times daily. 120 tablet 0   No current facility-administered  medications for this visit.      Allergies:  Allergies  Allergen Reactions  . Other Hives    NO "-CILLIN(s)"  . Penicillins Hives    Has patient had a PCN reaction causing immediate rash, facial/tongue/throat swelling, SOB or lightheadedness with hypotension: Yes Has patient had a PCN reaction causing severe rash involving mucus membranes or skin necrosis: No Has patient had a PCN reaction that  required hospitalization: No Has patient had a PCN reaction occurring within the last 10 years: No If all of the above answers are "NO", then may proceed with Cephalosporin use.     Past Medical History, Surgical history, Social history, and Family History were reviewed and updated.  Physical Exam: Blood pressure 115/77, pulse (!) 110, temperature 98.1 F (36.7 C), temperature source Oral, resp. rate 18, height _0  (1.626 m), weight 212 lb (96.2 kg), SpO2 100 %. ECOG: 1 General appearance: Alert, awake woman without distress. Head: Normocephalic, without obvious abnormality no oral ulcers or lesions. Neck: no adenopathy Lymph nodes: Cervical, supraclavicular, and axillary nodes normal. Heart:regular rate and rhythm, S1, S2 normal, no murmur, click, rub or gallop Lung:chest clear, no wheezing, rales, normal symmetric air entry Abdomin: soft, non-tender, without masses or organomegaly no rebound or guarding. EXT:no erythema, induration, or nodules Neurological examination: No deficits.  Lab Results: Lab Results  Component Value Date   WBC 4.1 11/24/2016   HGB 14.3 11/24/2016   HCT 41.3 11/24/2016   MCV 94.4 11/24/2016   PLT 115 (L) 11/24/2016     Chemistry      Component Value Date/Time   NA 138 11/05/2016 1408   K 4.8 11/05/2016 1408   CL 101 11/05/2016 1408   CO2 26 11/05/2016 1408   BUN 28 (H) 11/05/2016 1408   CREATININE 0.88 11/05/2016 1408      Component Value Date/Time   CALCIUM 9.2 11/05/2016 1408   ALKPHOS 60 10/26/2016 0930   AST 33 10/26/2016 0930   ALT 58 (H) 10/26/2016 0930   BILITOT 0.6 10/26/2016 0930       Impression and Plan:   31 year old woman with the following issues:  1. Metastatic melanoma of unknown primary. She presented with CNS metastasis as well as lung, ovarian and lymph node involvement. This is biopsy-proven to be metastatic melanoma with BRAF status is Positive.  She is currently on Dabrafenib + Trametinib starting on  11/19/2016 and have tolerated it well. She has not reported any complication related to this medication at this time. Other complications that to be expected were reviewed today which includes constitutional symptoms and fevers.  The plan is to continue with the same dose and scheduled for this time being we'll repeat imaging studies in 2 months.   2. CNS metastasis:Radiation therapy concluded on 11/12/2016. She is currently on dexamethasone 4 mg every 12 hours. She will stay on the current dose of dexamethasone for the time being.  3. Anxiety: Prescription for Ativan was was refilled today.  4. Prognosis: The goals of therapy were reviewed today which include palliative at this time. Despite her age and reasonable functional status, she does have an incurable malignancy.   5. Cardiac toxicity surveillance: Her baseline EKG showed normal QTC intervals. We will obtain an echocardiogram for baseline purposes.  6. Follow-up: Will be in the next 2 weeks to follow-up on her progress.  [facility], MD 2/19/20189:27 AM

## 2016-11-25 ENCOUNTER — Ambulatory Visit (HOSPITAL_COMMUNITY)
Admission: RE | Admit: 2016-11-25 | Discharge: 2016-11-25 | Disposition: A | Discharge status: Home / Self Care | Payer: Medicaid Other | Source: Ambulatory Visit | Attending: Oncology | Admitting: Oncology

## 2016-11-25 ENCOUNTER — Other Ambulatory Visit: Payer: Self-pay | Admitting: Oncology

## 2016-11-25 DIAGNOSIS — C799 Secondary malignant neoplasm of unspecified site: Secondary | ICD-10-CM | POA: Insufficient documentation

## 2016-11-25 DIAGNOSIS — I517 Cardiomegaly: Secondary | ICD-10-CM | POA: Insufficient documentation

## 2016-11-25 DIAGNOSIS — Z720 Tobacco use: Secondary | ICD-10-CM | POA: Diagnosis not present

## 2016-11-25 DIAGNOSIS — C439 Malignant melanoma of skin, unspecified: Secondary | ICD-10-CM

## 2016-11-25 DIAGNOSIS — Z09 Encounter for follow-up examination after completed treatment for conditions other than malignant neoplasm: Secondary | ICD-10-CM | POA: Diagnosis not present

## 2016-11-25 NOTE — Progress Notes (Signed)
  Echocardiogram 2D Echocardiogram has been performed.  Jennette Dubin 11/25/2016, 11:01 AM

## 2016-11-26 ENCOUNTER — Other Ambulatory Visit: Payer: Self-pay | Admitting: Oncology

## 2016-12-08 ENCOUNTER — Telehealth: Payer: Self-pay | Admitting: Oncology

## 2016-12-08 ENCOUNTER — Encounter: Payer: Self-pay | Admitting: *Deleted

## 2016-12-08 ENCOUNTER — Ambulatory Visit (HOSPITAL_BASED_OUTPATIENT_CLINIC_OR_DEPARTMENT_OTHER): Payer: Self-pay | Admitting: Oncology

## 2016-12-08 ENCOUNTER — Other Ambulatory Visit (HOSPITAL_BASED_OUTPATIENT_CLINIC_OR_DEPARTMENT_OTHER): Payer: Self-pay

## 2016-12-08 VITALS — BP 118/72 | HR 92 | Temp 97.9°F | Resp 18 | Ht 64.0 in | Wt 218.9 lb

## 2016-12-08 DIAGNOSIS — C799 Secondary malignant neoplasm of unspecified site: Secondary | ICD-10-CM

## 2016-12-08 DIAGNOSIS — C439 Malignant melanoma of skin, unspecified: Secondary | ICD-10-CM

## 2016-12-08 DIAGNOSIS — F411 Generalized anxiety disorder: Secondary | ICD-10-CM

## 2016-12-08 DIAGNOSIS — C7940 Secondary malignant neoplasm of unspecified part of nervous system: Secondary | ICD-10-CM

## 2016-12-08 LAB — CBC WITH DIFFERENTIAL/PLATELET
BASO%: 0.3 % (ref 0.0–2.0)
Basophils Absolute: 0 10*3/uL (ref 0.0–0.1)
EOS%: 0.3 % (ref 0.0–7.0)
Eosinophils Absolute: 0 10*3/uL (ref 0.0–0.5)
HEMATOCRIT: 39 % (ref 34.8–46.6)
HGB: 13.3 g/dL (ref 11.6–15.9)
LYMPH#: 2.1 10*3/uL (ref 0.9–3.3)
LYMPH%: 38.1 % (ref 14.0–49.7)
MCH: 31.7 pg (ref 25.1–34.0)
MCHC: 34 g/dL (ref 31.5–36.0)
MCV: 93.4 fL (ref 79.5–101.0)
MONO#: 0.5 10*3/uL (ref 0.1–0.9)
MONO%: 8.7 % (ref 0.0–14.0)
NEUT#: 2.8 10*3/uL (ref 1.5–6.5)
NEUT%: 52.6 % (ref 38.4–76.8)
Platelets: 198 10*3/uL (ref 145–400)
RBC: 4.18 10*6/uL (ref 3.70–5.45)
RDW: 14.5 % (ref 11.2–14.5)
WBC: 5.4 10*3/uL (ref 3.9–10.3)

## 2016-12-08 LAB — COMPREHENSIVE METABOLIC PANEL
ALT: 49 U/L (ref 0–55)
AST: 21 U/L (ref 5–34)
Albumin: 3.3 g/dL — ABNORMAL LOW (ref 3.5–5.0)
Alkaline Phosphatase: 92 U/L (ref 40–150)
Anion Gap: 9 mEq/L (ref 3–11)
BUN: 11.2 mg/dL (ref 7.0–26.0)
CALCIUM: 9 mg/dL (ref 8.4–10.4)
CHLORIDE: 105 meq/L (ref 98–109)
CO2: 27 meq/L (ref 22–29)
CREATININE: 0.7 mg/dL (ref 0.6–1.1)
EGFR: >90 mL/min/{1.73_m2} (ref >90)
GLUCOSE: 94 mg/dL (ref 70–140)
POTASSIUM: 4 meq/L (ref 3.5–5.1)
Sodium: 141 mEq/L (ref 136–145)
Total Bilirubin: 0.35 mg/dL (ref 0.20–1.20)
Total Protein: 6.4 g/dL (ref 6.4–8.3)

## 2016-12-08 MED ORDER — LORAZEPAM 1 MG PO TABS: 1.0000 mg | ORAL_TABLET | Freq: Three times a day (TID) | ORAL | 0 refills | Status: DC | PRN

## 2016-12-08 MED ORDER — DEXAMETHASONE 4 MG PO TABS
4.0000 mg | ORAL_TABLET | Freq: Four times a day (QID) | ORAL | 0 refills | Status: AC
Start: 2016-12-08 — End: ?

## 2016-12-08 NOTE — Progress Notes (Signed)
Hematology and Oncology Follow Up Visit  Julia Luna [id number] 1986/03/30 30 y.o. 12/08/2016 11:03 AM [name], MDAmao, [name], MD   Principle Diagnosis: 31 year old with stage for melanoma diagnosed in January 2018. She presented with brain metastasis, lung lesions and bilateral ovarian masses. This is biopsy-proven to be melanoma of unknown primary. BRAF Status is positive.   Prior Therapy:   She is status post lymph node biopsy obtained on 10/24/2016.  Whole brain radiation therapy started on 10/31/2016. Ccompletion 11/12/2016.  Current therapy:   Dabrafinib 150 mg total dose twice a day with Trametinib 2 mg daily. Therapy started on 11/19/2016.  Interim History: Julia Luna presents today for a follow-up visit. Since the last visit, she reports no recent changes in her health. She continues to exhibit signs or symptoms of recovery and overall feeling close to baseline. She denied any complications to her melanoma treatment. She denied any nausea, vomiting or fevers. She does report some arthralgias but these are chronic in nature. She denied any fevers associated with this treatment.   Her appetite continues to be excellent on dexamethasone and have gained weight. She denied any new neurological deficits including headaches, blurry vision or seizures. She denied any abdominal pain or dyspepsia. She denied any lower extremity edema or dyspnea on exertion.  She denied any falls or syncope. She does not report any fevers, chills, sweats or weight loss. She does not report any chest pain, palpitation, orthopnea or leg edema. She does not report any cough, wheezing or hemoptysis. She does not report any nausea, vomiting or abdominal pain. She does not report any frequency urgency or hesitancy. She does not report any skeletal complaints. Remaining review of systems unremarkable.  Medications: I have reviewed the patient's current medications.  Current Outpatient Prescriptions   Medication Sig Dispense Refill  . butalbital-acetaminophen-caffeine (FIORICET, ESGIC) 50-325-40 MG tablet Take 1 tablet by mouth every 4 (four) hours as needed for headache. 14 tablet 0  . dabrafenib mesylate (TAFINLAR) 75 MG capsule Take 2 capsules (150 mg total) by mouth 2 (two) times daily. Take on an empty stomach 1 hour before or 2 hours after meals. 120 capsule 0  . dexamethasone (DECADRON) 4 MG tablet Take 1 tablet (4 mg total) by mouth 4 (four) times daily. 120 tablet 0  . DULoxetine (CYMBALTA) 60 MG capsule Take 1 capsule (60 mg total) by mouth daily. 30 capsule 3  . emollient (BIAFINE) cream Apply topically as needed. 454 g 0  . esomeprazole (NEXIUM) 20 MG capsule Take 20 mg by mouth daily at 12 noon.    [name] LORazepam (ATIVAN) 1 MG tablet Take 1 tablet (1 mg total) by mouth every 8 (eight) hours as needed. for anxiety 30 tablet 0  . meloxicam (MOBIC) 15 MG tablet Take 1 tablet (15 mg total) by mouth daily. 60 tablet 0  . methocarbamol (ROBAXIN) 500 MG tablet Take 1 tablet (500 mg total) by mouth every 8 (eight) hours as needed for muscle spasms. 30 tablet 0  . morphine (MSIR) 15 MG tablet Take 1 tablet (15 mg total) by mouth every 6 (six) hours as needed for severe pain. 60 tablet 0  . polyethylene glycol (MIRALAX / GLYCOLAX) packet Take 17 g by mouth daily. 14 each 0  . prochlorperazine (COMPAZINE) 10 MG tablet Take 1 tablet (10 mg total) by mouth every 6 (six) hours as needed for nausea or vomiting. 30 tablet 1  . trametinib dimethyl sulfoxide (MEKINIST) 2 MG tablet Take 1 tablet (2 mg  total) by mouth daily. Take 1 hour before or 2 hours after meals. Store refrigerated in original container. 30 tablet 0   No current facility-administered medications for this visit.      Allergies:  Allergies  Allergen Reactions  . Other Hives    NO "-CILLIN(s)"  . Penicillins Hives    Has patient had a PCN reaction causing immediate rash, facial/tongue/throat swelling, SOB or lightheadedness with  hypotension: Yes Has patient had a PCN reaction causing severe rash involving mucus membranes or skin necrosis: No Has patient had a PCN reaction that required hospitalization: No Has patient had a PCN reaction occurring within the last 10 years: No If all of the above answers are "NO", then may proceed with Cephalosporin use.     Past Medical History, Surgical history, Social history, and Family History were reviewed and updated.  Physical Exam: Blood pressure 118/72, pulse 92, temperature 97.9 F (36.6 C), temperature source Oral, resp. rate 18, height '5\' 4"'$  (1.626 m), weight 218 lb 14.4 oz (99.3 kg), SpO2 99 %. ECOG: 1 General appearance: Well appearing woman appeared without distress. Head: Normocephalic, without obvious abnormality no oral thrush or ulcers. Neck: no adenopathy Lymph nodes: Cervical, supraclavicular, and axillary nodes normal. Heart:regular rate and rhythm, S1, S2 normal, no murmur, click, rub or gallop Lung:chest clear, no wheezing, rales, normal symmetric air entry Abdomin: soft, non-tender, without masses or organomegaly no shifting dullness or ascites. EXT:no erythema, induration, or nodules Neurological examination: No deficits.  Lab Results: Lab Results  Component Value Date   WBC 5.4 12/08/2016   HGB 13.3 12/08/2016   HCT 39.0 12/08/2016   MCV 93.4 12/08/2016   PLT 198 12/08/2016     Chemistry      Component Value Date/Time   NA 139 11/24/2016 0847   K 4.8 11/24/2016 0847   CL 101 11/05/2016 1408   CO2 26 11/24/2016 0847   BUN 21.1 11/24/2016 0847   CREATININE 0.7 11/24/2016 0847      Component Value Date/Time   CALCIUM 9.0 11/24/2016 0847   ALKPHOS 83 11/24/2016 0847   AST 25 11/24/2016 0847   ALT 82 (H) 11/24/2016 0847   BILITOT 0.48 11/24/2016 0847     Left ventricle: The cavity size was mildly dilated. Systolic   function was moderately reduced. The estimated ejection fraction   was in the range of 35% to 40%. Diffuse hypokinesis.  The study is   not technically sufficient to allow evaluation of LV diastolic   function.  Impression and Plan:   31 year old woman with the following issues:  1. Metastatic melanoma of unknown primary. She presented with CNS metastasis as well as lung, ovarian and lymph node involvement. This is biopsy-proven to be metastatic melanoma with BRAF status is Positive.  She is currently on Dabrafenib + Trametinib starting on 11/19/2016 and have tolerated it well.   The plan is to continue with the same dose and scheduled for this time being we'll repeat imaging studies after a total of 3 months of treatment.   2. CNS metastasis:Radiation therapy concluded on 11/12/2016. She is currently on dexamethasone 4 mg every 12 hours. She will stay on the current dose of dexamethasone for the time being.  3. Anxiety: Prescription for Ativan was was refilled today.  4. Prognosis: The goals of therapy were reviewed today which include palliative at this time. She remains in excellent performance status and quality of life and the plan is to continue at this time.  5. Cardiac toxicity  surveillance: Her baseline echocardiogram did show global hypokinesis with EF for close to 40%. The plan is to continue to monitor this and repeat an echo in about 3 months. Referral to cardiology would be considered if there is any other changes.  6. Follow-up: Will be in the next 3 weeks to follow-up on her progress.  [facility], MD 3/5/201811:03 AM

## 2016-12-08 NOTE — Telephone Encounter (Signed)
Gave patient AVS and calender per 12/08/2016 los

## 2016-12-10 ENCOUNTER — Other Ambulatory Visit: Payer: Self-pay | Admitting: *Deleted

## 2016-12-10 MED ORDER — DABRAFENIB MESYLATE 75 MG PO CAPS: 150.0000 mg | ORAL_CAPSULE | Freq: Two times a day (BID) | ORAL | 0 refills | Status: DC

## 2016-12-10 MED FILL — DEXAMETHASONE 4 MG TABLET: 4 | 30 days supply | Qty: 120 | Fill #0

## 2016-12-10 MED FILL — LORazepam 1 MG TABS: 1 | 10 days supply | Qty: 30 | Fill #0

## 2016-12-11 ENCOUNTER — Other Ambulatory Visit: Payer: Self-pay | Admitting: *Deleted

## 2016-12-11 MED ORDER — DABRAFENIB MESYLATE 75 MG PO CAPS: 150.0000 mg | ORAL_CAPSULE | Freq: Two times a day (BID) | ORAL | 0 refills | Status: DC

## 2016-12-11 MED ORDER — TRAMETINIB DIMETHYL SULFOXIDE 2 MG PO TABS: 2.0000 mg | ORAL_TABLET | Freq: Every day | ORAL | 0 refills | Status: DC

## 2016-12-11 NOTE — Progress Notes (Signed)
[place] Work  Clinical Social Work met with patient in [place] office for individual counseling today.  Patient and CSW continue to explore patient's emotional response to changes associated with cancer diagnosis.  Common themes include loss of independence, loss of control, identifying support network, processing grief.  Patient scheduled for next counseling session after upcoming medical oncology visit.  Maryjean Morn, MSW, LCSW, OSW-C Clinical Social Worker [facility] [phone]

## 2016-12-17 NOTE — Progress Notes (Addendum)
Roseanne Reno 31 y.o. woman with metastatic melanoma and innumerable brain metastases completed radiation 11-12-16 one month FU.   Headache:None Pain:None Dizziness:Yes when she bends over Nausea/Vomiting/Ataxia: Yes taking Zofran Visional changes(Blurred/Diplopia (double vision), blind spots and peripheral vision changes):None Ring in ears:None Fatigue:Yes most of the day, taking naps. Cognitive changes:Feels that she will blank out and forget what she is trying to say then her memory recall comes back to her. Weight: Wt Readings from Last 3 Encounters:  12/31/16 222 lb 3.2 oz (100.8 kg)  12/29/16 220 lb (99.8 kg)  12/08/16 218 lb 14.4 oz (99.3 kg)  Appetite: Good eating three meals per day. Imaging:None Lab: 12-29-16 CBC w diff and Cmet BP 113/70   Pulse 93   Temp 98.1 F (36.7 C) (Oral)   Resp 18   Ht 5\' 4"  (1.626 m)   Wt 222 lb 3.2 oz (100.8 kg)   SpO2 98%   BMI 38.14 kg/m

## 2016-12-23 MED FILL — $CYMBALTA 60 MG CAPSULE: 60 | 30 days supply | Qty: 30 | Fill #1

## 2016-12-29 ENCOUNTER — Ambulatory Visit (HOSPITAL_BASED_OUTPATIENT_CLINIC_OR_DEPARTMENT_OTHER): Payer: Self-pay | Admitting: Oncology

## 2016-12-29 ENCOUNTER — Other Ambulatory Visit: Payer: Self-pay | Admitting: *Deleted

## 2016-12-29 ENCOUNTER — Encounter: Payer: Self-pay | Admitting: *Deleted

## 2016-12-29 ENCOUNTER — Other Ambulatory Visit (HOSPITAL_BASED_OUTPATIENT_CLINIC_OR_DEPARTMENT_OTHER): Payer: Self-pay

## 2016-12-29 ENCOUNTER — Telehealth: Payer: Self-pay | Admitting: Oncology

## 2016-12-29 VITALS — BP 105/66 | HR 94 | Temp 97.9°F | Resp 18 | Ht 64.0 in | Wt 220.0 lb

## 2016-12-29 DIAGNOSIS — C439 Malignant melanoma of skin, unspecified: Secondary | ICD-10-CM

## 2016-12-29 DIAGNOSIS — C799 Secondary malignant neoplasm of unspecified site: Secondary | ICD-10-CM

## 2016-12-29 DIAGNOSIS — C7961 Secondary malignant neoplasm of right ovary: Secondary | ICD-10-CM

## 2016-12-29 DIAGNOSIS — C7962 Secondary malignant neoplasm of left ovary: Secondary | ICD-10-CM

## 2016-12-29 DIAGNOSIS — C7800 Secondary malignant neoplasm of unspecified lung: Secondary | ICD-10-CM

## 2016-12-29 DIAGNOSIS — C7931 Secondary malignant neoplasm of brain: Secondary | ICD-10-CM

## 2016-12-29 LAB — CBC WITH DIFFERENTIAL/PLATELET
BASO%: 0.6 % (ref 0.0–2.0)
BASOS ABS: 0 10*3/uL (ref 0.0–0.1)
EOS ABS: 0 10*3/uL (ref 0.0–0.5)
EOS%: 0.3 % (ref 0.0–7.0)
HEMATOCRIT: 39.1 % (ref 34.8–46.6)
HGB: 13.6 g/dL (ref 11.6–15.9)
LYMPH#: 2.4 10*3/uL (ref 0.9–3.3)
LYMPH%: 38.6 % (ref 14.0–49.7)
MCH: 33 pg (ref 25.1–34.0)
MCHC: 34.7 g/dL (ref 31.5–36.0)
MCV: 94.9 fL (ref 79.5–101.0)
MONO#: 0.3 10*3/uL (ref 0.1–0.9)
MONO%: 4.8 % (ref 0.0–14.0)
NEUT#: 3.5 10*3/uL (ref 1.5–6.5)
NEUT%: 55.7 % (ref 38.4–76.8)
PLATELETS: 207 10*3/uL (ref 145–400)
RBC: 4.12 10*6/uL (ref 3.70–5.45)
RDW: 16 % — ABNORMAL HIGH (ref 11.2–14.5)
WBC: 6.3 10*3/uL (ref 3.9–10.3)

## 2016-12-29 LAB — COMPREHENSIVE METABOLIC PANEL
ALT: 30 U/L (ref 0–55)
ANION GAP: 10 meq/L (ref 3–11)
AST: 18 U/L (ref 5–34)
Albumin: 3.4 g/dL — ABNORMAL LOW (ref 3.5–5.0)
Alkaline Phosphatase: 84 U/L (ref 40–150)
BUN: 16.5 mg/dL (ref 7.0–26.0)
CALCIUM: 9.1 mg/dL (ref 8.4–10.4)
CHLORIDE: 105 meq/L (ref 98–109)
CO2: 24 mEq/L (ref 22–29)
CREATININE: 0.8 mg/dL (ref 0.6–1.1)
EGFR: >90 mL/min/{1.73_m2} (ref >90)
Glucose: 123 mg/dl (ref 70–140)
Potassium: 4 mEq/L (ref 3.5–5.1)
Sodium: 140 mEq/L (ref 136–145)
Total Bilirubin: 0.37 mg/dL (ref 0.20–1.20)
Total Protein: 6.6 g/dL (ref 6.4–8.3)

## 2016-12-29 MED ORDER — TRAMETINIB DIMETHYL SULFOXIDE 2 MG PO TABS: 2.0000 mg | ORAL_TABLET | Freq: Every day | ORAL | 0 refills | Status: DC

## 2016-12-29 MED ORDER — ONDANSETRON 4 MG PO TBDP: 4.0000 mg | ORAL_TABLET | Freq: Three times a day (TID) | ORAL | 1 refills | Status: DC | PRN

## 2016-12-29 MED ORDER — DABRAFENIB MESYLATE 75 MG PO CAPS: 150.0000 mg | ORAL_CAPSULE | Freq: Two times a day (BID) | ORAL | 0 refills | Status: DC

## 2016-12-29 MED FILL — ONDANSETRON ODT 4 MG TABLET: 4 | 10 days supply | Qty: 30 | Fill #0

## 2016-12-29 NOTE — Progress Notes (Signed)
Hematology and Oncology Follow Up Visit  Julia Luna [id number] 1986-03-21 31 y.o. 12/29/2016 9:31 AM Julia Luna, MDAmao, Julia Ferretti, MD   Principle Diagnosis: 31 year old with stage for melanoma diagnosed in January 2018. She presented with brain metastasis, lung lesions and bilateral ovarian masses. This is biopsy-proven to be melanoma of unknown primary. BRAF Status is positive.   Prior Therapy:   She is status post lymph node biopsy obtained on 10/24/2016.  Whole brain radiation therapy started on 10/31/2016. Ccompletion 11/12/2016.  Current therapy:   Dabrafinib 150 mg total dose twice a day with Trametinib 2 mg daily. Therapy started on 11/19/2016.  Interim History: Julia Luna presents today for a follow-up visit. Since the last visit, she continues to show improvement in her overall health. She does report some mild nausea associated with this therapy but no vomiting. Her appetite remain excellent and her weight is stable. She denied any other complications to her melanoma treatment. She denied fevers, chills or sweats. She does report some arthralgias but these are chronic in nature. Her performance status and activity level remains stable.  She denied any headaches, blurry vision or falls. She denied any seizure activity or neurological deficits. She still able to attend to her activities of daily living without any decline.  She denied any falls or syncope. She does not report any fevers, chills, sweats or weight loss. She does not report any chest pain, palpitation, orthopnea or leg edema. She does not report any cough, wheezing or hemoptysis. She does not report any nausea, vomiting or abdominal pain. She does not report any frequency urgency or hesitancy. She does not report any skeletal complaints. Remaining review of systems unremarkable.  Medications: I have reviewed the patient's current medications.  Current Outpatient Prescriptions  Medication Sig Dispense Refill  .  butalbital-acetaminophen-caffeine (FIORICET, ESGIC) 50-325-40 MG tablet Take 1 tablet by mouth every 4 (four) hours as needed for headache. 14 tablet 0  . dabrafenib mesylate (TAFINLAR) 75 MG capsule Take 2 capsules (150 mg total) by mouth 2 (two) times daily. Take on an empty stomach 1 hour before or 2 hours after meals. 120 capsule 0  . dexamethasone (DECADRON) 4 MG tablet Take 1 tablet (4 mg total) by mouth 4 (four) times daily. 120 tablet 0  . DULoxetine (CYMBALTA) 60 MG capsule Take 1 capsule (60 mg total) by mouth daily. 30 capsule 3  . emollient (BIAFINE) cream Apply topically as needed. 454 g 0  . esomeprazole (NEXIUM) 20 MG capsule Take 20 mg by mouth daily at 12 noon.    [name] LORazepam (ATIVAN) 1 MG tablet Take 1 tablet (1 mg total) by mouth every 8 (eight) hours as needed. for anxiety 30 tablet 0  . meloxicam (MOBIC) 15 MG tablet Take 1 tablet (15 mg total) by mouth daily. 60 tablet 0  . methocarbamol (ROBAXIN) 500 MG tablet Take 1 tablet (500 mg total) by mouth every 8 (eight) hours as needed for muscle spasms. 30 tablet 0  . morphine (MSIR) 15 MG tablet Take 1 tablet (15 mg total) by mouth every 6 (six) hours as needed for severe pain. 60 tablet 0  . ondansetron (ZOFRAN ODT) 4 MG disintegrating tablet Take 1 tablet (4 mg total) by mouth every 8 (eight) hours as needed for nausea or vomiting (place under tongue). 30 tablet 1  . polyethylene glycol (MIRALAX / GLYCOLAX) packet Take 17 g by mouth daily. 14 each 0  . prochlorperazine (COMPAZINE) 10 MG tablet Take 1 tablet (10 mg total)  by mouth every 6 (six) hours as needed for nausea or vomiting. 30 tablet 1  . trametinib dimethyl sulfoxide (MEKINIST) 2 MG tablet Take 1 tablet (2 mg total) by mouth daily. Take 1 hour before or 2 hours after meals. Store refrigerated in original container. 30 tablet 0   No current facility-administered medications for this visit.      Allergies:  Allergies  Allergen Reactions  . Other Hives    NO  "-CILLIN(s)"  . Penicillins Hives    Has patient had a PCN reaction causing immediate rash, facial/tongue/throat swelling, SOB or lightheadedness with hypotension: Yes Has patient had a PCN reaction causing severe rash involving mucus membranes or skin necrosis: No Has patient had a PCN reaction that required hospitalization: No Has patient had a PCN reaction occurring within the last 10 years: No If all of the above answers are "NO", then may proceed with Cephalosporin use.     Past Medical History, Surgical history, Social history, and Family History were reviewed and updated.  Physical Exam: Blood pressure 105/66, pulse 94, temperature 97.9 F (36.6 C), temperature source Oral, resp. rate 18, height '5\' 4"'$  (1.626 m), weight 220 lb (99.8 kg), SpO2 99 %. ECOG: 1 General appearance: Alert, awake woman appeared without distress.. Head: Normocephalic, without obvious abnormality no oral ulcers or lesions. Neck: no adenopathy Lymph nodes: Cervical, supraclavicular, and axillary nodes normal. Heart:regular rate and rhythm, S1, S2 normal, no murmur, click, rub or gallop Lung:chest clear, no wheezing, rales, normal symmetric air entry Abdomin: soft, non-tender, without masses or organomegaly no rebound or guarding. EXT:no erythema, induration, or nodules Neurological examination: No deficits.  Lab Results: Lab Results  Component Value Date   WBC 6.3 12/29/2016   HGB 13.6 12/29/2016   HCT 39.1 12/29/2016   MCV 94.9 12/29/2016   PLT 207 12/29/2016     Chemistry      Component Value Date/Time   NA 141 12/08/2016 1033   K 4.0 12/08/2016 1033   CL 101 11/05/2016 1408   CO2 27 12/08/2016 1033   BUN 11.2 12/08/2016 1033   CREATININE 0.7 12/08/2016 1033      Component Value Date/Time   CALCIUM 9.0 12/08/2016 1033   ALKPHOS 92 12/08/2016 1033   AST 21 12/08/2016 1033   ALT 49 12/08/2016 1033   BILITOT 0.35 12/08/2016 1033     Left ventricle: The cavity size was mildly dilated.  Systolic   function was moderately reduced. The estimated ejection fraction   was in the range of 35% to 40%. Diffuse hypokinesis. The study is   not technically sufficient to allow evaluation of LV diastolic   function.  Impression and Plan:   31 year old woman with the following issues:  1. Metastatic melanoma of unknown primary. She presented with CNS metastasis as well as lung, ovarian and lymph node involvement. This is biopsy-proven to be metastatic melanoma with BRAF status is Positive.  She is currently on Dabrafenib + Trametinib starting on 11/19/2016 and have tolerated it well.   Risks and benefits of continuing these medications were reviewed today she is agreeable to continue. They'll be no dose reduction or delay at this time. She will have a PET CT scan scheduled for 01/16/2017 for staging purposes.   2. CNS metastasis:Radiation therapy concluded on 11/12/2016. She is currently on dexamethasone 4 mg every 12 hours. She will stay on the current dose of dexamethasone for the time being.  3. Anxiety: Prescription for Ativan as been made available to the patient.  4. Prognosis: The goals of therapy were reviewed today which include palliative at this time. Her performance status remains adequate at this time.  5. Cardiac toxicity surveillance: Her baseline echocardiogram did show global hypokinesis with EF for close to 40%. The plan is to continue to monitor this and repeat an echo in about 3 months. Referral to cardiology would be considered if there is any other changes.  6. Follow-up: Will be in the next 3 weeks to follow-up on her progress.  [facility], MD 3/26/20189:31 AM

## 2016-12-29 NOTE — Telephone Encounter (Signed)
Gave patient AVS and calender per 12/29/2016 los. [facility] to contact patient with PET Scan .

## 2016-12-31 ENCOUNTER — Ambulatory Visit: Payer: Self-pay | Admitting: Family Medicine

## 2016-12-31 ENCOUNTER — Encounter: Payer: Self-pay | Admitting: Radiation Oncology

## 2016-12-31 ENCOUNTER — Other Ambulatory Visit: Payer: Self-pay

## 2016-12-31 ENCOUNTER — Ambulatory Visit
Admission: RE | Admit: 2016-12-31 | Discharge: 2016-12-31 | Disposition: A | Discharge status: Home / Self Care | Payer: Medicaid Other | Source: Ambulatory Visit | Attending: Radiation Oncology | Admitting: Radiation Oncology

## 2016-12-31 VITALS — BP 113/70 | HR 93 | Temp 98.1°F | Resp 18 | Ht 64.0 in | Wt 222.2 lb

## 2016-12-31 DIAGNOSIS — C439 Malignant melanoma of skin, unspecified: Secondary | ICD-10-CM | POA: Diagnosis present

## 2016-12-31 DIAGNOSIS — Z880 Allergy status to penicillin: Secondary | ICD-10-CM | POA: Insufficient documentation

## 2016-12-31 DIAGNOSIS — C7931 Secondary malignant neoplasm of brain: Secondary | ICD-10-CM | POA: Diagnosis present

## 2016-12-31 DIAGNOSIS — C799 Secondary malignant neoplasm of unspecified site: Secondary | ICD-10-CM

## 2016-12-31 DIAGNOSIS — Z79899 Other long term (current) drug therapy: Secondary | ICD-10-CM | POA: Insufficient documentation

## 2016-12-31 MED ORDER — DULOXETINE HCL 60 MG PO CPEP: 60.0000 mg | ORAL_CAPSULE | Freq: Every day | ORAL | 3 refills | Status: AC

## 2016-12-31 NOTE — Progress Notes (Signed)
Radiation Oncology         (336) [phone] ________________________________  Name: Julia Luna MRN: [id number]  Date: 12/31/2016  DOB: 1985-10-21  Post Treatment Note  CC: [name], MD  Wyatt Portela, MD  Diagnosis:   31 y.o. woman with metastatic melanoma and innumerable brain metastases  Interval Since Last Radiation: 7 weeks   10/30/16 - 11/12/16:  The whole brain was treated to 30 Gy in 10 fractions of 3 Gy  Narrative:  The patient returns today for routine follow-up.  The patient tolerated radiation treatment relatively well. She experienced headaches that resolved quickly without intervention, cramps in her left calf, shades in her visual field, difficulty sleeping, unsteady gait, and vaginal discharge all of which have since resolved. She is currently taking Decadron 4 mg BID.                            On review of systems, the patient states that she has been sleeping more than usual over the past 7-10 days but otherwise is without complaints.  She denies CP, SOB, headaches, visual changes, vomiting, diarrhea, fever or chills. She has not had any seizure activity.  She has some mild nausea which she associates with her targeted therapy on Dabrafinib and Trametinib.  ALLERGIES:  is allergic to other and penicillins.  Meds: Current Outpatient Prescriptions  Medication Sig Dispense Refill  . dabrafenib mesylate (TAFINLAR) 75 MG capsule Take 2 capsules (150 mg total) by mouth 2 (two) times daily. Take on an empty stomach 1 hour before or 2 hours after meals. 120 capsule 0  . dexamethasone (DECADRON) 4 MG tablet Take 1 tablet (4 mg total) by mouth 4 (four) times daily. (Patient taking differently: Take 4 mg by mouth 2 (two) times daily. ) 120 tablet 0  . esomeprazole (NEXIUM) 20 MG capsule Take 20 mg by mouth daily at 12 noon.    [name] LORazepam (ATIVAN) 1 MG tablet Take 1 tablet (1 mg total) by mouth every 8 (eight) hours as needed. for anxiety 30 tablet 0  . meloxicam (MOBIC) 15  MG tablet Take 1 tablet (15 mg total) by mouth daily. 60 tablet 0  . methocarbamol (ROBAXIN) 500 MG tablet Take 1 tablet (500 mg total) by mouth every 8 (eight) hours as needed for muscle spasms. 30 tablet 0  . morphine (MSIR) 15 MG tablet Take 1 tablet (15 mg total) by mouth every 6 (six) hours as needed for severe pain. 60 tablet 0  . ondansetron (ZOFRAN ODT) 4 MG disintegrating tablet Take 1 tablet (4 mg total) by mouth every 8 (eight) hours as needed for nausea or vomiting (place under tongue). 30 tablet 1  . trametinib dimethyl sulfoxide (MEKINIST) 2 MG tablet Take 1 tablet (2 mg total) by mouth daily. Take 1 hour before or 2 hours after meals. Store refrigerated in original container. 30 tablet 0  . butalbital-acetaminophen-caffeine (FIORICET, ESGIC) 50-325-40 MG tablet Take 1 tablet by mouth every 4 (four) hours as needed for headache. (Patient not taking: Reported on 12/31/2016) 14 tablet 0  . DULoxetine (CYMBALTA) 60 MG capsule Take 1 capsule (60 mg total) by mouth daily. 90 capsule 3  . polyethylene glycol (MIRALAX / GLYCOLAX) packet Take 17 g by mouth daily. (Patient not taking: Reported on 12/31/2016) 14 each 0  . prochlorperazine (COMPAZINE) 10 MG tablet Take 1 tablet (10 mg total) by mouth every 6 (six) hours as needed for nausea or vomiting. (  Patient not taking: Reported on 12/31/2016) 30 tablet 1   No current facility-administered medications for this encounter.     Physical Findings:  height is 5\' 4"  (1.626 m) and weight is 222 lb 3.2 oz (100.8 kg). Her oral temperature is 98.1 F (36.7 C). Her blood pressure is 113/70 and her pulse is 93. Her respiration is 18 and oxygen saturation is 98%.  Pain Assessment Pain Score: 0-No pain/10 In general this is a well appearing caucasian female in no acute distress. She's alert and oriented x4 and appropriate throughout the examination. Cardiopulmonary assessment is negative for acute distress and she exhibits normal effort. She is  neurologically intact and without deficit.  Lab Findings: Lab Results  Component Value Date   WBC 6.3 12/29/2016   HGB 13.6 12/29/2016   HCT 39.1 12/29/2016   MCV 94.9 12/29/2016   PLT 207 12/29/2016     Radiographic Findings: No results found.  Impression/Plan: 1. 31 y.o. woman with metastatic melanoma and innumerable brain metastases.  She has recovered well from the effects of radiotherapy. She is scheduled for a PET/CT scan on 01/16/2017 for restaging and follow-up with Dr. Alen Blew on 01/19/17. She is due for a posttreatment MRI of the brain which we will schedule in the next 1-2 weeks. As long as her scan is stable, we will plan to repeat an MRI at 3 months and she will follow-up in our office at that time. She will begin a dose taper of her Dexamethasone, decreasing to 4mg  po qd x1 week and then 2mg  po qd x 1 week prior to discontinuing.  She knows to call if she has any difficulty with the taper and/or if she has any questions or concerns related to her treatment in the interim.  Otherwise, we will plan to see her back in 3 months pending findings on upcoming MRI brain.    Nicholos Johns, PA-C

## 2017-01-02 ENCOUNTER — Other Ambulatory Visit: Payer: Self-pay | Admitting: Radiation Therapy

## 2017-01-02 DIAGNOSIS — C7931 Secondary malignant neoplasm of brain: Secondary | ICD-10-CM

## 2017-01-05 ENCOUNTER — Other Ambulatory Visit: Payer: Self-pay | Admitting: *Deleted

## 2017-01-05 DIAGNOSIS — C799 Secondary malignant neoplasm of unspecified site: Secondary | ICD-10-CM

## 2017-01-05 DIAGNOSIS — C439 Malignant melanoma of skin, unspecified: Secondary | ICD-10-CM

## 2017-01-07 ENCOUNTER — Telehealth: Payer: Self-pay | Admitting: *Deleted

## 2017-01-07 NOTE — Telephone Encounter (Signed)
Returning mother's phone call regarding issues the patient has been having. Swelling and severe headaches when changing positions. Per Dr. Alen Blew, have the patient increase her Decadron. I stated,"if she is taking decadron once a day, she needs to increase it to two a day. If she is taking it twice a day, she needs to increase it to three times a day."  Instructed the mom that if the symptoms get worse, take her to the ER to be assessed. This message was left on the mothers cell phone per her request.

## 2017-01-08 ENCOUNTER — Telehealth: Payer: Self-pay | Admitting: *Deleted

## 2017-01-08 NOTE — Telephone Encounter (Signed)
Followed up from my call last night that was left on mom's cell phone. She verbalized understanding of taking decadron. Patient was tapering from two tablets to one tablet. Patient didn't take decadron Sunday night and woke up Monday morning with severe headaches and dizziness. Patient has gone back to taking two tablets daily.

## 2017-01-14 ENCOUNTER — Other Ambulatory Visit: Payer: Self-pay | Admitting: Radiation Oncology

## 2017-01-16 ENCOUNTER — Telehealth: Payer: Self-pay | Admitting: *Deleted

## 2017-01-16 ENCOUNTER — Encounter: Payer: Self-pay | Admitting: *Deleted

## 2017-01-16 ENCOUNTER — Ambulatory Visit (HOSPITAL_COMMUNITY): Payer: Medicaid Other

## 2017-01-16 ENCOUNTER — Other Ambulatory Visit: Payer: Self-pay

## 2017-01-16 ENCOUNTER — Other Ambulatory Visit (HOSPITAL_BASED_OUTPATIENT_CLINIC_OR_DEPARTMENT_OTHER): Payer: Medicaid Other

## 2017-01-16 DIAGNOSIS — C439 Malignant melanoma of skin, unspecified: Secondary | ICD-10-CM | POA: Diagnosis present

## 2017-01-16 DIAGNOSIS — C799 Secondary malignant neoplasm of unspecified site: Secondary | ICD-10-CM

## 2017-01-16 DIAGNOSIS — C7800 Secondary malignant neoplasm of unspecified lung: Secondary | ICD-10-CM

## 2017-01-16 LAB — COMPREHENSIVE METABOLIC PANEL
ALBUMIN: 2.8 g/dL — AB (ref 3.5–5.0)
ALT: 140 U/L — AB (ref 0–55)
AST: 80 U/L — ABNORMAL HIGH (ref 5–34)
Alkaline Phosphatase: 146 U/L (ref 40–150)
Anion Gap: 10 mEq/L (ref 3–11)
BILIRUBIN TOTAL: 0.58 mg/dL (ref 0.20–1.20)
BUN: 8.4 mg/dL (ref 7.0–26.0)
CO2: 26 meq/L (ref 22–29)
Calcium: 9.1 mg/dL (ref 8.4–10.4)
Chloride: 102 mEq/L (ref 98–109)
Creatinine: 0.8 mg/dL (ref 0.6–1.1)
EGFR: >90 mL/min/{1.73_m2} (ref >90)
GLUCOSE: 138 mg/dL (ref 70–140)
POTASSIUM: 3.6 meq/L (ref 3.5–5.1)
Sodium: 137 mEq/L (ref 136–145)
TOTAL PROTEIN: 6.6 g/dL (ref 6.4–8.3)

## 2017-01-16 LAB — CBC WITH DIFFERENTIAL/PLATELET
BASO%: 0.3 % (ref 0.0–2.0)
BASOS ABS: 0 10*3/uL (ref 0.0–0.1)
EOS ABS: 0 10*3/uL (ref 0.0–0.5)
EOS%: 0.3 % (ref 0.0–7.0)
HCT: 32.7 % — ABNORMAL LOW (ref 34.8–46.6)
HEMOGLOBIN: 11.2 g/dL — AB (ref 11.6–15.9)
LYMPH%: 17 % (ref 14.0–49.7)
MCH: 31.6 pg (ref 25.1–34.0)
MCHC: 34.3 g/dL (ref 31.5–36.0)
MCV: 92.1 fL (ref 79.5–101.0)
MONO#: 0.4 10*3/uL (ref 0.1–0.9)
MONO%: 9.7 % (ref 0.0–14.0)
NEUT%: 72.7 % (ref 38.4–76.8)
NEUTROS ABS: 3.3 10*3/uL (ref 1.5–6.5)
PLATELETS: 177 10*3/uL (ref 145–400)
RBC: 3.54 10*6/uL — AB (ref 3.70–5.45)
RDW: 14.6 % — ABNORMAL HIGH (ref 11.2–14.5)
WBC: 4.5 10*3/uL (ref 3.9–10.3)
lymph#: 0.8 10*3/uL — ABNORMAL LOW (ref 0.9–3.3)

## 2017-01-16 MED ORDER — LORAZEPAM 1 MG PO TABS: 1.0000 mg | ORAL_TABLET | Freq: Three times a day (TID) | ORAL | 0 refills | Status: DC | PRN

## 2017-01-16 MED FILL — LORazepam 1 MG TABS: 1 | 10 days supply | Qty: 30 | Fill #0

## 2017-01-16 NOTE — Progress Notes (Signed)
[place] Clinical Social Work  LATE NOTE: Clinical Social Work met with patient in [place] office on 01/14/17 for counseling session.  CSW and patient continue to explore adjusting to life with cancer.  CSW provided supportive therapy. Patient plans to follow up with CSW after oncologist appointments next week.  Maryjean Morn, MSW, LCSW, OSW-C Clinical Social Worker [facility] [phone]

## 2017-01-16 NOTE — Telephone Encounter (Signed)
Mother dale calling from the lobby to request a hard copy of ativan. Signed script given to patient's mother

## 2017-01-16 NOTE — Telephone Encounter (Signed)
Spoke with patient's mother, dale. Per dr Alen Blew patient is to see [facility] in rad onc on 4/16 after scan on 4/14. Cancelled appt with dr Alen Blew on 4/16. Mother verbalized understanding.

## 2017-01-16 NOTE — Progress Notes (Unsigned)
Spoke with patient's mother, dale. Per dr Alen Blew, patient is to see [facility] in rad onc on Monday 4/16 after scan is done on 01/17/17.

## 2017-01-17 ENCOUNTER — Ambulatory Visit
Admission: RE | Admit: 2017-01-17 | Discharge: 2017-01-17 | Disposition: A | Discharge status: Home / Self Care | Payer: Medicaid Other | Source: Ambulatory Visit | Attending: Radiation Oncology | Admitting: Radiation Oncology

## 2017-01-17 ENCOUNTER — Inpatient Hospital Stay: Admission: RE | Admit: 2017-01-17 | Payer: Self-pay | Source: Ambulatory Visit

## 2017-01-17 DIAGNOSIS — C7931 Secondary malignant neoplasm of brain: Secondary | ICD-10-CM

## 2017-01-17 MED ORDER — GADOBENATE DIMEGLUMINE 529 MG/ML IV SOLN
20.0000 mL | Freq: Once | INTRAVENOUS | Status: AC | PRN
  Administered 2017-01-17: 20 mL via INTRAVENOUS

## 2017-01-19 ENCOUNTER — Encounter: Payer: Self-pay | Admitting: Radiation Oncology

## 2017-01-19 ENCOUNTER — Ambulatory Visit: Payer: Self-pay | Admitting: Oncology

## 2017-01-19 ENCOUNTER — Ambulatory Visit
Admission: RE | Admit: 2017-01-19 | Discharge: 2017-01-19 | Disposition: A | Discharge status: Home / Self Care | Payer: Medicaid Other | Source: Ambulatory Visit | Attending: Radiation Oncology | Admitting: Radiation Oncology

## 2017-01-19 ENCOUNTER — Telehealth: Payer: Self-pay | Admitting: *Deleted

## 2017-01-19 ENCOUNTER — Other Ambulatory Visit: Payer: Self-pay | Admitting: Radiation Therapy

## 2017-01-19 ENCOUNTER — Inpatient Hospital Stay
Admission: RE | Admit: 2017-01-19 | Discharge: 2017-01-19 | Disposition: A | Discharge status: Home / Self Care | Payer: Self-pay | Source: Ambulatory Visit | Attending: Radiation Oncology | Admitting: Radiation Oncology

## 2017-01-19 VITALS — BP 111/70 | HR 105 | Temp 97.5°F | Resp 18 | Ht 64.0 in | Wt 217.4 lb

## 2017-01-19 DIAGNOSIS — F17210 Nicotine dependence, cigarettes, uncomplicated: Secondary | ICD-10-CM | POA: Insufficient documentation

## 2017-01-19 DIAGNOSIS — Z808 Family history of malignant neoplasm of other organs or systems: Secondary | ICD-10-CM | POA: Diagnosis not present

## 2017-01-19 DIAGNOSIS — C799 Secondary malignant neoplasm of unspecified site: Secondary | ICD-10-CM

## 2017-01-19 DIAGNOSIS — M1990 Unspecified osteoarthritis, unspecified site: Secondary | ICD-10-CM | POA: Diagnosis not present

## 2017-01-19 DIAGNOSIS — C7931 Secondary malignant neoplasm of brain: Secondary | ICD-10-CM

## 2017-01-19 DIAGNOSIS — M797 Fibromyalgia: Secondary | ICD-10-CM | POA: Diagnosis not present

## 2017-01-19 DIAGNOSIS — C439 Malignant melanoma of skin, unspecified: Secondary | ICD-10-CM | POA: Diagnosis present

## 2017-01-19 HISTORY — DX: Secondary malignant neoplasm of brain: C79.31

## 2017-01-19 NOTE — Telephone Encounter (Signed)
Returned mother's phone call regarding appointments. Mom stated,"we need to make a follow up appointment with Dr. Alen Blew to review PET scan results. Scan is scheduled for 4/24."  Informed her I would leave Dr. Alen Blew a note. She verbalized understanding.

## 2017-01-19 NOTE — Progress Notes (Addendum)
Julia Luna 31 y.o.woman with metastatic melanoma andinnumerable brain metastases completed radiation 11-12-16,review MRI brain w wo contrast one month FU.   PAIN: He is currently not having pain; reports headaches when she sleeps and muscle pain. NEURO:Alert and oriented x 3 with fluent speech,able to complete sentences.  reports difficulty with word finding or organization of sentences at times Denies any visual disturbance,blurred vision,double vision,blind spots or peripheral vision changes.  Reports  nausea and vomiting taking Zofran,ataxia fell down the stairs this morning looking for her mother. Denies ring of ears or dizziness.  SKIN:Skin warm and cool to touch, dry around sides and forehead.  Redness to cheeks. Decardron bid 4 mg in am and 2 mg at hs total of 6 mg daily. No evidence of thrush   Wt Readings from Last 3 Encounters:  01/19/17 217 lb 6.4 oz (98.6 kg)  12/31/16 222 lb 3.2 oz (100.8 kg)  12/29/16 220 lb (99.8 kg)  BP 111/70   Pulse (!) 105   Temp 97.5 F (36.4 C) (Oral)   Resp 18   Ht 5\' 4"  (1.626 m)   Wt 217 lb 6.4 oz (98.6 kg)   SpO2 99%   BMI 37.32 kg/m

## 2017-01-19 NOTE — Addendum Note (Signed)
Encounter addended by: Malena Edman, RN on: 01/19/2017  3:32 PM<BR>    Actions taken: Charge Capture section accepted

## 2017-01-19 NOTE — Progress Notes (Signed)
Radiation Oncology         (336) [phone] ________________________________  Name: Julia Luna MRN: [id number]  Date: 01/19/2017  DOB: 01/25/1986  Post Treatment Note  CC: [name], MD  Wyatt Portela, MD  Diagnosis:   31 y.o. woman with metastatic melanoma and innumerable brain metastases  Interval Since Last Radiation: 2 months  10/30/16 - 11/12/16:  The whole brain was treated to 30 Gy in 10 fractions of 3 Gy  Narrative:  In summary this is a 31 y.o. woman with a history of metastatic melanoma to the brain. She was found to have innumerable lesions in the brain in January 2018 and completed a course of whole brain radiotherapy.  She continues on Dabragenib and Trametinib, and is due for a PET scan and follow up with Dr. Alen Blew. She started to taper her course of dexamethasone last week of March, however developed rebound symptoms of dizziness, headache, and feeling off balance. She was counseled on resuming dexamethasone at 6 mg 3 times a day. She is currently on 4 mg of dexamethasone in the morning and 2 mg in the evening and has been on this dose for the last 2 weeks. Her MRI scan performed on 01/17/2017 reveals improvement of the larger lesions, and persistence of innumerable not brain lesions. No evidence of edema is noted. She comes review this.                          On review of systems, the patient reports that sh is doing well overall. She at times is having a hard time with word finding or injury certain activities she previously loved. Orders that she's also been having quite a lot of nausea which she believes is from her targeted medication. She also reports that she is taking Zofran ODT when these episodes occurred. She denies any persistent headaches since resuming her dexamethasone at a higher dose, and reports that she is not having any trouble with  chest pain, shortness of breath, cough, fevers, chills, night sweats, unintended weight changes. She denies any bowel or  bladder disturbances, and denies abdominal pain. She denies any new musculoskeletal or joint aches or pains, new skin lesions or concerns. A complete review of systems is obtained and is otherwise negative.  Past Medical History:  Past Medical History:  Diagnosis Date  . Fibromyalgia   . Malignant melanoma metastatic to brain ([place]) 10/2016  . Osteoarthritis   . Skin cancer    melanoma    Past Surgical History: Past Surgical History:  Procedure Laterality Date  . CESAREAN SECTION    . CHOLECYSTECTOMY      Social History:  Social History   Social History  . Marital status: Single    Spouse name: N/A  . Number of children: N/A  . Years of education: N/A   Occupational History  . Not on file.   Social History Main Topics  . Smoking status: Current Every Day Smoker    Packs/day: 0.75    Years: 10.00    Types: Cigarettes  . Smokeless tobacco: Never Used  . Alcohol use Yes     Comment: Socially   . Drug use: No  . Sexual activity: Not on file   Other Topics Concern  . Not on file   Social History Narrative  . No narrative on file  The patient is single. She lives with her parents in [place].   Family History: Family History  Problem Relation Age of Onset  . Thyroid cancer Mother   . Cardiomyopathy Father     ALLERGIES:  is allergic to other and penicillins.  Meds: Current Outpatient Prescriptions  Medication Sig Dispense Refill  . butalbital-acetaminophen-caffeine (FIORICET, ESGIC) 50-325-40 MG tablet Take 1 tablet by mouth every 4 (four) hours as needed for headache. 14 tablet 0  . dabrafenib mesylate (TAFINLAR) 75 MG capsule Take 2 capsules (150 mg total) by mouth 2 (two) times daily. Take on an empty stomach 1 hour before or 2 hours after meals. 120 capsule 0  . dexamethasone (DECADRON) 4 MG tablet Take 1 tablet (4 mg total) by mouth 4 (four) times daily. (Patient taking differently: Take 4 mg by mouth 2 (two) times daily. Taking one tablet in the  morning and 1/2 in the evening total of 6 mg daily) 120 tablet 0  . DULoxetine (CYMBALTA) 60 MG capsule Take 1 capsule (60 mg total) by mouth daily. 90 capsule 3  . esomeprazole (NEXIUM) 20 MG capsule Take 20 mg by mouth daily at 12 noon.    [name] LORazepam (ATIVAN) 1 MG tablet Take 1 tablet (1 mg total) by mouth every 8 (eight) hours as needed. for anxiety 30 tablet 0  . meloxicam (MOBIC) 15 MG tablet Take 1 tablet (15 mg total) by mouth daily. 60 tablet 0  . methocarbamol (ROBAXIN) 500 MG tablet Take 1 tablet (500 mg total) by mouth every 8 (eight) hours as needed for muscle spasms. 30 tablet 0  . morphine (MSIR) 15 MG tablet Take 1 tablet (15 mg total) by mouth every 6 (six) hours as needed for severe pain. 60 tablet 0  . ondansetron (ZOFRAN ODT) 4 MG disintegrating tablet Take 1 tablet (4 mg total) by mouth every 8 (eight) hours as needed for nausea or vomiting (place under tongue). 30 tablet 1  . trametinib dimethyl sulfoxide (MEKINIST) 2 MG tablet Take 1 tablet (2 mg total) by mouth daily. Take 1 hour before or 2 hours after meals. Store refrigerated in original container. 30 tablet 0  . polyethylene glycol (MIRALAX / GLYCOLAX) packet Take 17 g by mouth daily. (Patient not taking: Reported on 12/31/2016) 14 each 0  . prochlorperazine (COMPAZINE) 10 MG tablet Take 1 tablet (10 mg total) by mouth every 6 (six) hours as needed for nausea or vomiting. (Patient not taking: Reported on 12/31/2016) 30 tablet 1   No current facility-administered medications for this encounter.     Physical Findings:  height is 5\' 4"  (1.626 m) and weight is 217 lb 6.4 oz (98.6 kg). Her oral temperature is 97.5 F (36.4 C). Her blood pressure is 111/70 and her pulse is 105 (abnormal). Her respiration is 18 and oxygen saturation is 99%.  Pain Assessment Pain Score: 0-No pain/10 In general this is a well appearing caucasian female in no acute distress. She's alert and oriented x4 and appropriate throughout the examination.  Cardiopulmonary assessment is negative for acute distress and she exhibits normal effort. She is neurologically intact and without deficit.  Lab Findings: Lab Results  Component Value Date   WBC 4.5 01/16/2017   HGB 11.2 (L) 01/16/2017   HCT 32.7 (L) 01/16/2017   MCV 92.1 01/16/2017   PLT 177 01/16/2017     Radiographic Findings: Mr Jeri Cos PJ Contrast  Result Date: 01/17/2017 CLINICAL DATA:  31 year old female with metastatic melanoma, including innumerable brain metastases, is 9 weeks status post whole brain radiation completed on 11/12/2016. EXAM: MRI HEAD WITHOUT AND WITH CONTRAST TECHNIQUE:  Multiplanar, multiecho pulse sequences of the brain and surrounding structures were obtained without and with intravenous contrast. CONTRAST:  83mL MULTIHANCE GADOBENATE DIMEGLUMINE 529 MG/ML IV SOLN COMPARISON:  Pretreatment brain MRI 10/23/2016 FINDINGS: Brain: Regressed and largely resolved multifocal brain edema in both hemispheres since the pretreatment study. Residual widespread T2 and FLAIR hyperintensity now mostly related to the individual melanoma metastases. T1 intrinsic and enhancing innumerable enhancing brain metastases persist (series 12) with bilateral cerebellar hemisphere, cerebral hemisphere, and bilateral deep gray matter nuclei and brainstem involvement. However, the largest lesions are all decreased in size since the prior study (e.g.  right parietal vertex previously 22 mm now 12 mm, posterior left parietal lobe previously 22 mm now 11 mm, right anterior inferior frontal lobe previously 25 mm, now 20 mL), and left anterior superior frontal lobe previously 12 mm now 9 mm), No interval lesion enlargement identified, and the overall number of metastases does not seem increased. Although there are numerous peripherally located lesions there is no leptomeningeal thickening or enhancement, and no abnormal pachymeningeal thickening or enhancement. Many of the lesions demonstrate petechial  hemorrhage as before. No malignant hemorrhagic transformation. Improved ventricular patency with no ventriculomegaly. No midline shift. Basilar cisterns are patent. Negative pituitary. No restricted diffusion or evidence of acute infarction. Vascular: Major intracranial vascular flow voids are stable to improved, dural venous sinus patency appears improved. Skull and upper cervical spine: Visible bone marrow signal remains within normal limits. Negative visualized cervical spine and spinal cord. Sinuses/Orbits: Stable and negative. Other: Bilateral mastoid effusions have mildly increased. Other visible internal auditory structures are within normal limits. No suspicious scalp soft tissue lesion. IMPRESSION: 1. Persistent innumerable melanoma metastases throughout the brain, but some positive response to therapy including: largely resolved cerebral edema, decreased size of the largest metastases, and no increase in the overall number of metastases since the pretreatment MRI. 2. No meningeal involvement. No skull metastasis identified, and negative visualized cervical spine and spinal cord. Electronically Signed   By: Genevie Ann M.D.   On: 01/17/2017 16:58    Impression/Plan: 1. 31 y.o. woman with metastatic melanoma and innumerable brain metastases.  The patient appears to be doing well in the CNS per her most recent MRI. We will proceed with repeat MRI of the brain in 3 months' time. She has a PET scan ordered, and is waiting to have this performed. She will see medical oncology as well to discuss this and future plans for systemic therap, and she continues Dabrafinib. 2.  Goals of care and coping through her diagnosis. The patient is leaking regularly with social work regarding her medical condition, and is interested in possibly speaking with palliative care medicine for similar discussion, I will ask our nursing practitioner [facility] with palliative medicine to help reach out to the patient to see if there  are any resources they have available that could be of benefit to her.    Carola Rhine, PAC

## 2017-01-20 ENCOUNTER — Telehealth: Payer: Self-pay | Admitting: Oncology

## 2017-01-20 MED FILL — $CYMBALTA 60 MG CAPSULE: 60 | 30 days supply | Qty: 30 | Fill #0

## 2017-01-20 NOTE — Telephone Encounter (Signed)
Called patient to inform her of next scheduled appointment for 4.25.18.

## 2017-01-20 NOTE — Telephone Encounter (Signed)
Message sent to scheduling for 4/25 appointment at 9:45 am

## 2017-01-22 ENCOUNTER — Telehealth: Payer: Self-pay | Admitting: Pharmacist

## 2017-01-22 DIAGNOSIS — C799 Secondary malignant neoplasm of unspecified site: Secondary | ICD-10-CM

## 2017-01-22 DIAGNOSIS — C439 Malignant melanoma of skin, unspecified: Secondary | ICD-10-CM

## 2017-01-22 NOTE — Telephone Encounter (Signed)
Oral Chemotherapy Pharmacist Encounter  I spoke with patient's mother, Quita Skye, this morning about pending Medicaid. Patient has been approved for Medicaid coverage, however, they have not yet received patient's Medicaid card.   Once this is received, we can send prescriptions for Tafinlar and Mekinist to the [facility] for prescription processing. Quita Skye will bring in [facility] card to [facility] for scanning into EMR when they get it.  When we know that there are no issues with patient being able to receive these medications from the local pharmacy, [facility] will follow-up with manufacturer about patient's change in insurance.  [name], PharmD, BCPS, BCOP 01/22/2017  9:51 AM Oral Oncology Clinic [phone]

## 2017-01-27 ENCOUNTER — Encounter (HOSPITAL_COMMUNITY)
Admission: RE | Admit: 2017-01-27 | Discharge: 2017-01-27 | Disposition: A | Discharge status: Home / Self Care | Payer: Medicaid Other | Source: Ambulatory Visit | Attending: Oncology | Admitting: Oncology

## 2017-01-27 ENCOUNTER — Other Ambulatory Visit: Payer: Medicaid Other

## 2017-01-27 DIAGNOSIS — C799 Secondary malignant neoplasm of unspecified site: Secondary | ICD-10-CM | POA: Diagnosis not present

## 2017-01-27 DIAGNOSIS — C439 Malignant melanoma of skin, unspecified: Secondary | ICD-10-CM

## 2017-01-27 LAB — GLUCOSE, CAPILLARY: GLUCOSE-CAPILLARY: 99 mg/dL (ref 65–99)

## 2017-01-27 MED ORDER — FLUDEOXYGLUCOSE F - 18 (FDG) INJECTION
12.5000 | Freq: Once | INTRAVENOUS | Status: AC | PRN
  Administered 2017-01-27: 12.5 via INTRAVENOUS

## 2017-01-28 ENCOUNTER — Ambulatory Visit (HOSPITAL_BASED_OUTPATIENT_CLINIC_OR_DEPARTMENT_OTHER): Payer: Medicaid Other | Admitting: Oncology

## 2017-01-28 ENCOUNTER — Telehealth: Payer: Self-pay | Admitting: Oncology

## 2017-01-28 DIAGNOSIS — C7949 Secondary malignant neoplasm of other parts of nervous system: Secondary | ICD-10-CM | POA: Diagnosis not present

## 2017-01-28 DIAGNOSIS — C779 Secondary and unspecified malignant neoplasm of lymph node, unspecified: Secondary | ICD-10-CM | POA: Diagnosis not present

## 2017-01-28 DIAGNOSIS — C799 Secondary malignant neoplasm of unspecified site: Secondary | ICD-10-CM

## 2017-01-28 DIAGNOSIS — C7800 Secondary malignant neoplasm of unspecified lung: Secondary | ICD-10-CM | POA: Diagnosis not present

## 2017-01-28 DIAGNOSIS — C7960 Secondary malignant neoplasm of unspecified ovary: Secondary | ICD-10-CM | POA: Diagnosis not present

## 2017-01-28 DIAGNOSIS — C801 Malignant (primary) neoplasm, unspecified: Secondary | ICD-10-CM | POA: Diagnosis not present

## 2017-01-28 DIAGNOSIS — C439 Malignant melanoma of skin, unspecified: Secondary | ICD-10-CM

## 2017-01-28 MED ORDER — LORAZEPAM 1 MG PO TABS
1.0000 mg | ORAL_TABLET | Freq: Three times a day (TID) | ORAL | 0 refills | Status: DC | PRN
Start: 2017-01-28 — End: 2017-03-16

## 2017-01-28 MED ORDER — ONDANSETRON 4 MG PO TBDP: 4.0000 mg | ORAL_TABLET | Freq: Three times a day (TID) | ORAL | 1 refills | Status: DC | PRN

## 2017-01-28 MED FILL — ONDANSETRON ODT 4 MG TABLET: 4 | 10 days supply | Qty: 30 | Fill #0

## 2017-01-28 MED FILL — LORazepam 1 MG TABS: 1 | 10 days supply | Qty: 30 | Fill #0

## 2017-01-28 NOTE — Progress Notes (Signed)
Hematology and Oncology Follow Up Visit  Julia Luna [id number] 28-May-1986 31 y.o. 01/28/2017 10:57 AM Julia Luna, MDAmao, Julia Ferretti, MD   Principle Diagnosis: 31 year old with stage for melanoma diagnosed in January 2018. She presented with brain metastasis, lung lesions and bilateral ovarian masses. This is biopsy-proven to be melanoma of unknown primary. BRAF Status is positive.   Prior Therapy:   She is status post lymph node biopsy obtained on 10/24/2016.  Whole brain radiation therapy started on 10/31/2016. Ccompletion 11/12/2016.  Current therapy:   Dabrafinib 150 mg total dose twice a day with Trametinib 2 mg daily. Therapy started on 11/19/2016.  Interim History: Ms. Julia Luna presents today for a follow-up visit. Since the last visit, she reports no major changes in her health. She reports some periodic vomiting recently associated with dexamethasone taper. She has been using Zofran and lorazepam which have helped her symptoms.  Her appetite remain excellent and her weight is stable. She denied any other complications to her melanoma treatment. She denied fevers, chills or sweats. She does report some arthralgias but these are chronic in nature. Her performance status remained unchanged.  She denied any new neurological symptoms. She did not report any headaches, blurry vision or falls. She denied any seizure activity or neurological deficits.   She denied any falls or syncope. She does not report any fevers, chills, sweats or weight loss. She does not report any chest pain, palpitation, orthopnea or leg edema. She does not report any cough, wheezing or hemoptysis. She does not report any nausea, vomiting or abdominal pain. She does not report any frequency urgency or hesitancy. She does not report any skeletal complaints. Remaining review of systems unremarkable.  Medications: I have reviewed the patient's current medications.  Current Outpatient Prescriptions  Medication Sig  Dispense Refill  . butalbital-acetaminophen-caffeine (FIORICET, ESGIC) 50-325-40 MG tablet Take 1 tablet by mouth every 4 (four) hours as needed for headache. 14 tablet 0  . dabrafenib mesylate (TAFINLAR) 75 MG capsule Take 2 capsules (150 mg total) by mouth 2 (two) times daily. Take on an empty stomach 1 hour before or 2 hours after meals. 120 capsule 0  . dexamethasone (DECADRON) 4 MG tablet Take 1 tablet (4 mg total) by mouth 4 (four) times daily. (Patient taking differently: Take 4 mg by mouth 2 (two) times daily. Taking one tablet in the morning and 1/2 in the evening total of 6 mg daily) 120 tablet 0  . DULoxetine (CYMBALTA) 60 MG capsule Take 1 capsule (60 mg total) by mouth daily. 90 capsule 3  . esomeprazole (NEXIUM) 20 MG capsule Take 20 mg by mouth daily at 12 noon.    [name] LORazepam (ATIVAN) 1 MG tablet Take 1 tablet (1 mg total) by mouth every 8 (eight) hours as needed. for anxiety 30 tablet 0  . meloxicam (MOBIC) 15 MG tablet Take 1 tablet (15 mg total) by mouth daily. 60 tablet 0  . methocarbamol (ROBAXIN) 500 MG tablet Take 1 tablet (500 mg total) by mouth every 8 (eight) hours as needed for muscle spasms. 30 tablet 0  . morphine (MSIR) 15 MG tablet Take 1 tablet (15 mg total) by mouth every 6 (six) hours as needed for severe pain. 60 tablet 0  . ondansetron (ZOFRAN ODT) 4 MG disintegrating tablet Take 1 tablet (4 mg total) by mouth every 8 (eight) hours as needed for nausea or vomiting (place under tongue). 30 tablet 1  . polyethylene glycol (MIRALAX / GLYCOLAX) packet Take 17 g by mouth  daily. (Patient not taking: Reported on 12/31/2016) 14 each 0  . prochlorperazine (COMPAZINE) 10 MG tablet Take 1 tablet (10 mg total) by mouth every 6 (six) hours as needed for nausea or vomiting. (Patient not taking: Reported on 12/31/2016) 30 tablet 1  . trametinib dimethyl sulfoxide (MEKINIST) 2 MG tablet Take 1 tablet (2 mg total) by mouth daily. Take 1 hour before or 2 hours after meals. Store  refrigerated in original container. 30 tablet 0   No current facility-administered medications for this visit.      Allergies:  Allergies  Allergen Reactions  . Other Hives    NO "-CILLIN(s)"  . Penicillins Hives    Has patient had a PCN reaction causing immediate rash, facial/tongue/throat swelling, SOB or lightheadedness with hypotension: Yes Has patient had a PCN reaction causing severe rash involving mucus membranes or skin necrosis: No Has patient had a PCN reaction that required hospitalization: No Has patient had a PCN reaction occurring within the last 10 years: No If all of the above answers are "NO", then may proceed with Cephalosporin use.     Past Medical History, Surgical history, Social history, and Family History were reviewed and updated.  Physical Exam: Blood pressure (!) 114/51, pulse 62, temperature 97.5 F (36.4 C), temperature source Oral, resp. rate 18, height '5\' 8"'$  (1.727 m), weight 216 lb 6.4 oz (98.2 kg), last menstrual period 01/06/2017, SpO2 99 %. ECOG: 1 General appearance: Comfortable-appearing woman without distress. Head: Normocephalic, without obvious abnormality no oral ulcers or thrush. Neck: no adenopathy Lymph nodes: Cervical, supraclavicular, and axillary nodes normal. Heart:regular rate and rhythm, S1, S2 normal, no murmur, click, rub or gallop Lung:chest clear, no wheezing, rales, normal symmetric air entry Abdomin: soft, non-tender, without masses or organomegaly no shifting dullness or ascites. EXT:no erythema, induration, or nodules Neurological examination: No deficits.  Lab Results: Lab Results  Component Value Date   WBC 4.5 01/16/2017   HGB 11.2 (L) 01/16/2017   HCT 32.7 (L) 01/16/2017   MCV 92.1 01/16/2017   PLT 177 01/16/2017     Chemistry      Component Value Date/Time   NA 137 01/16/2017 1223   K 3.6 01/16/2017 1223   CL 101 11/05/2016 1408   CO2 26 01/16/2017 1223   BUN 8.4 01/16/2017 1223   CREATININE 0.8  01/16/2017 1223      Component Value Date/Time   CALCIUM 9.1 01/16/2017 1223   ALKPHOS 146 01/16/2017 1223   AST 80 (H) 01/16/2017 1223   ALT 140 (H) 01/16/2017 1223   BILITOT 0.58 01/16/2017 1223     EXAM: NUCLEAR MEDICINE PET WHOLE BODY  TECHNIQUE: 12.5 mCi F-18 FDG was injected intravenously. Full-ring PET imaging was performed from the vertex to the feet after the radiotracer. CT data was obtained and used for attenuation correction and anatomic localization.  FASTING BLOOD GLUCOSE:  Value: 99 mg/dl  COMPARISON:  Multiple exams, including 10/23/2016  FINDINGS: HEAD/NECK  The patient is known to have innumerable small metastatic deposits in the brain. Some of these are visible on the CT data as high density lesions. Due to the small size, the lesions are not as conspicuous on the PET data, and only seen as some subtle heterogeneity of metabolic activity for example in the cerebellum. No definite scalp or calvarial lesions are appreciated. No nodal or other involvement in the neck identified.  CHEST  Scattered pulmonary nodules in both lungs favor metastatic melanoma. An index 1.1 by 0.9 cm right upper lobe nodule  on image 17/7 has a maximum SUV of 3.9.  A right axillary lymph node measuring 7 mm in short axis on image 94/ 3 has a maximum SUV of 4.0, mildly accentuated.  ABDOMEN/PELVIS  A somewhat dense left adrenal mass measuring 1.1 by 2.0 cm on image 137/3 is hypermetabolic with maximum SUV 10.3. This lesion is smaller than the prior adrenal mass in this location, which measured 2.9 by 2.0 cm. Mildly hypermetabolic small pelvic sidewall lymph nodes. A somewhat high density left inguinal lymph node measuring 1.3 cm in short axis has a maximum SUV of 6.9, favoring malignancy.  A faint focus of activity posteriorly in the spleen has a maximum standard uptake value of 5.9. Other normal-appearing portions of the spleen have a maximum standard uptake  value of about 4.3. A small splenic metastatic lesion is difficult to totally exclude in this circumstance.  There is a cystic right adnexal lesion measuring 5.5 by 4.2 cm on image 203/3, this appears to be photopenic.  SKELETON  No focal hypermetabolic activity to suggest skeletal metastasis.  EXTREMITIES  Focal right antecubital activity is thought to be injection related as this is indeed the site of the injection.  Along the medial left upper thigh just below the perineum, there is an indistinct 1.3 cm subcutaneous nodule which appears hypermetabolic, maximum SUV 11.3. A similar lesion at the same vertical level long the right upper medial thigh measures 0.6 by 1.2 cm with a maximum SUV of 6.6. Given the presence of the subcutaneous nodularity I doubt that this is simply due to incontinence and contamination. The appearance is concerning for small cutaneous and subcutaneous metastatic lesions.  A subtle focus of activity in the left lateral posterior soleus region has a maximum SUV of only 3.1, and could be postinflammatory, less likely malignant.  IMPRESSION: 1. Metastatic melanoma involving the brain, lungs, left adrenal gland, left inguinal lymph node, bilateral medial upper thigh and cutaneous and subcutaneous tissues, and a right axillary lymph node. Possible involvement in the left lateral posterior soleus muscle in the lower leg, and possible involvement in the posterior spleen. The hypermetabolic right upper lobe pulmonary nodule measures 1.1 by 0.9 cm, and previously measured 1.9 by 1.5 cm on 10/23/2016. Other pulmonary nodules are similarly significantly reduced in size since that time. 2. Right ovarian cyst.  EXAM: MRI HEAD WITHOUT AND WITH CONTRAST  TECHNIQUE: Multiplanar, multiecho pulse sequences of the brain and surrounding structures were obtained without and with intravenous contrast.  CONTRAST:  1mL MULTIHANCE GADOBENATE DIMEGLUMINE 529  MG/ML IV SOLN  COMPARISON:  Pretreatment brain MRI 10/23/2016  FINDINGS: Brain: Regressed and largely resolved multifocal brain edema in both hemispheres since the pretreatment study. Residual widespread T2 and FLAIR hyperintensity now mostly related to the individual melanoma metastases.  T1 intrinsic and enhancing innumerable enhancing brain metastases persist (series 12) with bilateral cerebellar hemisphere, cerebral hemisphere, and bilateral deep gray matter nuclei and brainstem involvement. However, the largest lesions are all decreased in size since the prior study (e.g.  right parietal vertex previously 22 mm now 12 mm, posterior left parietal lobe previously 22 mm now 11 mm, right anterior inferior frontal lobe previously 25 mm, now 20 mL), and left anterior superior frontal lobe previously 12 mm now 9 mm),  No interval lesion enlargement identified, and the overall number of metastases does not seem increased.  Although there are numerous peripherally located lesions there is no leptomeningeal thickening or enhancement, and no abnormal pachymeningeal thickening or enhancement.  Many of  the lesions demonstrate petechial hemorrhage as before. No malignant hemorrhagic transformation.  Improved ventricular patency with no ventriculomegaly. No midline shift. Basilar cisterns are patent. Negative pituitary. No restricted diffusion or evidence of acute infarction.  Vascular: Major intracranial vascular flow voids are stable to improved, dural venous sinus patency appears improved.  Skull and upper cervical spine: Visible bone marrow signal remains within normal limits. Negative visualized cervical spine and spinal cord.  Sinuses/Orbits: Stable and negative.  Other: Bilateral mastoid effusions have mildly increased. Other visible internal auditory structures are within normal limits. No suspicious scalp soft tissue lesion.  IMPRESSION: 1. Persistent  innumerable melanoma metastases throughout the brain, but some positive response to therapy including: largely resolved cerebral edema, decreased size of the largest metastases, and no increase in the overall number of metastases since the pretreatment MRI. 2. No meningeal involvement. No skull metastasis identified, and negative visualized cervical spine and spinal cord.    Impression and Plan:   31 year old woman with the following issues:  1. Metastatic melanoma of unknown primary. She presented with CNS metastasis as well as lung, ovarian and lymph node involvement. This is biopsy-proven to be metastatic melanoma with BRAF status is Positive.  She is currently on Dabrafenib + Trametinib starting on 11/19/2016 and have tolerated it well.   PET CT scan obtained on 01/17/2017 was reviewed today and showed excellent response to therapy.  Risks and benefits of continuing these medications were reviewed today she is agreeable to continue. She will have repeat PET/CT scan in 2 months. She understands that she will likely develop resistant to these medication and switch to immunotherapy would be the next step would   2. CNS metastasis:Radiation therapy concluded on 11/12/2016. She is currently on dexamethasone 2 mg every 12 hours. She will stay on the current dose of dexamethasone for the time being.  MRI obtained on 01/27/2017 showed good response to therapy.  3. Anxiety: Prescription for Ativan as been made available to the patient.  4. Prognosis: The goals of therapy were reviewed today which include palliative at this time. Her performance status remains adequate at this time.  5. Cardiac toxicity surveillance: Her baseline echocardiogram did show global hypokinesis with EF for close to 40%. The plan is to continue to monitor this and repeat an echo in about 2 months. Referral to cardiology would be considered if there is any other changes.  6. Follow-up: Will be in the next 3  weeks to follow-up on her progress.  [facility], MD 4/25/201810:57 AM

## 2017-01-28 NOTE — Telephone Encounter (Signed)
Gave patient AVS and calender per 4/25 los.  

## 2017-01-29 ENCOUNTER — Ambulatory Visit: Payer: Self-pay | Admitting: Urology

## 2017-02-10 ENCOUNTER — Other Ambulatory Visit: Payer: Self-pay | Admitting: Pharmacist

## 2017-02-10 ENCOUNTER — Other Ambulatory Visit: Payer: Self-pay | Admitting: *Deleted

## 2017-02-10 DIAGNOSIS — C439 Malignant melanoma of skin, unspecified: Secondary | ICD-10-CM

## 2017-02-10 DIAGNOSIS — C799 Secondary malignant neoplasm of unspecified site: Secondary | ICD-10-CM

## 2017-02-10 MED ORDER — DABRAFENIB MESYLATE 75 MG PO CAPS: 150.0000 mg | ORAL_CAPSULE | Freq: Two times a day (BID) | ORAL | 0 refills | Status: DC

## 2017-02-10 MED ORDER — TRAMETINIB DIMETHYL SULFOXIDE 2 MG PO TABS: 2.0000 mg | ORAL_TABLET | Freq: Every day | ORAL | 0 refills | Status: DC

## 2017-02-10 MED FILL — TAFINLAR 75 MG CAPSULE: 75 | 30 days supply | Qty: 120 | Fill #0

## 2017-02-10 MED FILL — MEKINIST 2 MG TAB: 2 | 30 days supply | Qty: 30 | Fill #0

## 2017-02-10 NOTE — Telephone Encounter (Signed)
Oral Chemotherapy Pharmacist Encounter  Medicaid card is now scanned in [place]. Prescriptions have been e-scribed to [facility]. Prescriptions were processed without issue and they will call patient for pick-up when ready. I called and LVM for patient's mother, Quita Skye, with information that prescriptions will be ready at the [facility], being paid for by [facility] Medicaid.  Oral Oncology Clinic will continue to follow.  [name], PharmD, BCPS, BCOP 02/10/2017  11:48 AM Oral Oncology Clinic [phone]

## 2017-02-11 ENCOUNTER — Telehealth: Payer: Self-pay | Admitting: *Deleted

## 2017-02-11 ENCOUNTER — Other Ambulatory Visit: Payer: Self-pay | Admitting: *Deleted

## 2017-02-11 MED ORDER — MELOXICAM 15 MG PO TABS: 15.0000 mg | ORAL_TABLET | Freq: Every day | ORAL | 0 refills | Status: DC

## 2017-02-11 MED ORDER — ONDANSETRON 4 MG PO TBDP: 4.0000 mg | ORAL_TABLET | Freq: Three times a day (TID) | ORAL | 1 refills | Status: DC | PRN

## 2017-02-11 MED FILL — MELOXICAM 15 MG TABLET: 15 | 30 days supply | Qty: 30 | Fill #0

## 2017-02-11 MED FILL — ONDANSETRON ODT 4 MG TABLET: 4 | 10 days supply | Qty: 30 | Fill #0

## 2017-02-11 NOTE — Telephone Encounter (Signed)
Mother,dale calling for a refill on meloxicam and zofran. Refills sent to [facility] patient pharmacy

## 2017-02-13 ENCOUNTER — Other Ambulatory Visit: Payer: Self-pay | Admitting: Oncology

## 2017-02-13 DIAGNOSIS — C439 Malignant melanoma of skin, unspecified: Secondary | ICD-10-CM

## 2017-02-13 DIAGNOSIS — C799 Secondary malignant neoplasm of unspecified site: Secondary | ICD-10-CM

## 2017-02-16 ENCOUNTER — Telehealth: Payer: Self-pay | Admitting: Oncology

## 2017-02-16 ENCOUNTER — Ambulatory Visit (HOSPITAL_BASED_OUTPATIENT_CLINIC_OR_DEPARTMENT_OTHER): Payer: Medicaid Other | Admitting: Oncology

## 2017-02-16 ENCOUNTER — Other Ambulatory Visit (HOSPITAL_BASED_OUTPATIENT_CLINIC_OR_DEPARTMENT_OTHER): Payer: Medicaid Other

## 2017-02-16 VITALS — BP 113/74 | HR 100 | Temp 98.1°F | Resp 16 | Ht 68.0 in | Wt 218.5 lb

## 2017-02-16 DIAGNOSIS — C7960 Secondary malignant neoplasm of unspecified ovary: Secondary | ICD-10-CM

## 2017-02-16 DIAGNOSIS — R001 Bradycardia, unspecified: Secondary | ICD-10-CM | POA: Diagnosis not present

## 2017-02-16 DIAGNOSIS — C439 Malignant melanoma of skin, unspecified: Secondary | ICD-10-CM | POA: Diagnosis not present

## 2017-02-16 DIAGNOSIS — C7800 Secondary malignant neoplasm of unspecified lung: Secondary | ICD-10-CM

## 2017-02-16 DIAGNOSIS — C799 Secondary malignant neoplasm of unspecified site: Secondary | ICD-10-CM

## 2017-02-16 DIAGNOSIS — C7931 Secondary malignant neoplasm of brain: Secondary | ICD-10-CM | POA: Diagnosis not present

## 2017-02-16 DIAGNOSIS — C779 Secondary and unspecified malignant neoplasm of lymph node, unspecified: Secondary | ICD-10-CM | POA: Diagnosis not present

## 2017-02-16 LAB — COMPREHENSIVE METABOLIC PANEL
ALT: 145 U/L — ABNORMAL HIGH (ref 0–55)
ANION GAP: 9 meq/L (ref 3–11)
AST: 87 U/L — AB (ref 5–34)
Albumin: 3 g/dL — ABNORMAL LOW (ref 3.5–5.0)
Alkaline Phosphatase: 140 U/L (ref 40–150)
BUN: 6.3 mg/dL — ABNORMAL LOW (ref 7.0–26.0)
CALCIUM: 8.8 mg/dL (ref 8.4–10.4)
CHLORIDE: 109 meq/L (ref 98–109)
CO2: 26 mEq/L (ref 22–29)
Creatinine: 0.8 mg/dL (ref 0.6–1.1)
Glucose: 94 mg/dl (ref 70–140)
POTASSIUM: 3.6 meq/L (ref 3.5–5.1)
Sodium: 144 mEq/L (ref 136–145)
Total Bilirubin: 0.39 mg/dL (ref 0.20–1.20)
Total Protein: 6.3 g/dL — ABNORMAL LOW (ref 6.4–8.3)

## 2017-02-16 LAB — CBC WITH DIFFERENTIAL/PLATELET
BASO%: 0.8 % (ref 0.0–2.0)
Basophils Absolute: 0 10*3/uL (ref 0.0–0.1)
EOS ABS: 0.1 10*3/uL (ref 0.0–0.5)
EOS%: 3.7 % (ref 0.0–7.0)
HEMATOCRIT: 34.7 % — AB (ref 34.8–46.6)
HGB: 11.7 g/dL (ref 11.6–15.9)
LYMPH#: 1 10*3/uL (ref 0.9–3.3)
LYMPH%: 32.3 % (ref 14.0–49.7)
MCH: 31.1 pg (ref 25.1–34.0)
MCHC: 33.7 g/dL (ref 31.5–36.0)
MCV: 92.3 fL (ref 79.5–101.0)
MONO#: 0.4 10*3/uL (ref 0.1–0.9)
MONO%: 13 % (ref 0.0–14.0)
NEUT#: 1.6 10*3/uL (ref 1.5–6.5)
NEUT%: 50.2 % (ref 38.4–76.8)
PLATELETS: 169 10*3/uL (ref 145–400)
RBC: 3.76 10*6/uL (ref 3.70–5.45)
RDW: 14.8 % — ABNORMAL HIGH (ref 11.2–14.5)
WBC: 3.2 10*3/uL — ABNORMAL LOW (ref 3.9–10.3)

## 2017-02-16 NOTE — Telephone Encounter (Signed)
Gave patient AVS and calender per 5/14 los. ECHO scheduled per Butch Penny .

## 2017-02-16 NOTE — Progress Notes (Signed)
Hematology and Oncology Follow Up Visit  Julia Luna [id number] 16-Oct-1985 31 y.o. 02/16/2017 9:39 AM Julia Luna, Julia Luna, Julia Ferretti, MD   Principle Diagnosis: 31 year old with stage for melanoma diagnosed in January 2018. She presented with brain metastasis, lung lesions and bilateral ovarian masses. This is biopsy-proven to be melanoma of unknown primary. BRAF Status is positive.   Prior Therapy:   She is status post lymph node biopsy obtained on 10/24/2016.  Whole brain radiation therapy started on 10/31/2016. Ccompletion 11/12/2016.  Current therapy:   Dabrafinib 150 mg total dose twice a day with Trametinib 2 mg daily. Therapy started on 11/19/2016.  Interim History: Julia Luna presents today for a follow-up visit. Since the last visit, she reports no new complaints or changes. She continues on slow dexamethasone taper and experiencing some side effects associated with it. She reports mild nausea that is manageable with Zofran and lorazepam. She does report episodic vomiting which helps her improve her nausea symptoms. Despite his symptoms, which is able to eat and maintain her weight.  She denied any other neurological symptoms such as headaches or seizures. She denies any weakness. She does report fatigue and tiredness. She continues to take Dabrafinib 150 mg total dose twice a day with Trametinib 2 mg daily. Without any new complications. She denied any skin rashes or lesions. She denied any fevers.   She denied any falls or syncope. She does not report any fevers, chills, sweats or weight loss. She does not report any chest pain, palpitation, orthopnea or leg edema. She does not report any cough, wheezing or hemoptysis. She does not report any nausea, vomiting or abdominal pain. She does not report any frequency urgency or hesitancy. She does not report any skeletal complaints. Remaining review of systems unremarkable.  Medications: I have reviewed the patient's current  medications.  Current Outpatient Prescriptions  Medication Sig Dispense Refill  . butalbital-acetaminophen-caffeine (FIORICET, ESGIC) 50-325-40 MG tablet Take 1 tablet by mouth every 4 (four) hours as needed for headache. 14 tablet 0  . dabrafenib mesylate (TAFINLAR) 75 MG capsule Take 2 capsules (150 mg total) by mouth 2 (two) times daily. Take on an empty stomach 1 hour before or 2 hours after meals. 120 capsule 0  . dexamethasone (DECADRON) 4 MG tablet Take 1 tablet (4 mg total) by mouth 4 (four) times daily. (Patient taking differently: Take 4 mg by mouth 2 (two) times daily. Taking one tablet in the morning and 1/2 in the evening total of 6 mg daily) 120 tablet 0  . DULoxetine (CYMBALTA) 60 MG capsule Take 1 capsule (60 mg total) by mouth daily. 90 capsule 3  . esomeprazole (NEXIUM) 20 MG capsule Take 20 mg by mouth daily at 12 noon.    [name] LORazepam (ATIVAN) 1 MG tablet Take 1 tablet (1 mg total) by mouth every 8 (eight) hours as needed. for anxiety 30 tablet 0  . meloxicam (MOBIC) 15 MG tablet Take 1 tablet (15 mg total) by mouth daily. 60 tablet 0  . methocarbamol (ROBAXIN) 500 MG tablet Take 1 tablet (500 mg total) by mouth every 8 (eight) hours as needed for muscle spasms. 30 tablet 0  . morphine (MSIR) 15 MG tablet Take 1 tablet (15 mg total) by mouth every 6 (six) hours as needed for severe pain. 60 tablet 0  . ondansetron (ZOFRAN ODT) 4 MG disintegrating tablet Take 1 tablet (4 mg total) by mouth every 8 (eight) hours as needed for nausea or vomiting (place under tongue). 30 tablet  1  . polyethylene glycol (MIRALAX / GLYCOLAX) packet Take 17 g by mouth daily. 14 each 0  . prochlorperazine (COMPAZINE) 10 MG tablet Take 1 tablet (10 mg total) by mouth every 6 (six) hours as needed for nausea or vomiting. 30 tablet 1  . trametinib dimethyl sulfoxide (MEKINIST) 2 MG tablet Take 1 tablet (2 mg total) by mouth daily. Take 1 hour before or 2 hours after meals. Store refrigerated in original  container. 30 tablet 0   No current facility-administered medications for this visit.      Allergies:  Allergies  Allergen Reactions  . Other Hives    NO "-CILLIN(s)"  . Penicillins Hives    Has patient had a PCN reaction causing immediate rash, facial/tongue/throat swelling, SOB or lightheadedness with hypotension: Yes Has patient had a PCN reaction causing severe rash involving mucus membranes or skin necrosis: No Has patient had a PCN reaction that required hospitalization: No Has patient had a PCN reaction occurring within the last 10 years: No If all of the above answers are "NO", then may proceed with Cephalosporin use.     Past Medical History, Surgical history, Social history, and Family History were reviewed and updated.  Physical Exam: Blood pressure 113/74, pulse 100, temperature 98.1 F (36.7 C), temperature source Oral, resp. rate 16, height _0  (1.727 m), weight 218 lb 8 oz (99.1 kg), SpO2 98 %. ECOG: 1 General appearance: Alert, awake woman appeared without distress. Head: Normocephalic, without obvious abnormality no oral ulcers or thrush. Neck: no adenopathy Lymph nodes: Cervical, supraclavicular, and axillary nodes normal. Heart:regular rate and rhythm, S1, S2 normal, no murmur, click, rub or gallop Lung:chest clear, no wheezing, rales, normal symmetric air entry Abdomin: soft, non-tender, without masses or organomegaly no rebound or guarding. EXT:no erythema, induration, or nodules Neurological examination: No deficits.  Lab Results: Lab Results  Component Value Date   WBC 3.2 (L) 02/16/2017   HGB 11.7 02/16/2017   HCT 34.7 (L) 02/16/2017   MCV 92.3 02/16/2017   PLT 169 02/16/2017     Chemistry      Component Value Date/Time   NA 144 02/16/2017 0905   K 3.6 02/16/2017 0905   CL 101 11/05/2016 1408   CO2 26 02/16/2017 0905   BUN 6.3 (L) 02/16/2017 0905   CREATININE 0.8 02/16/2017 0905      Component Value Date/Time   CALCIUM 8.8 02/16/2017  0905   ALKPHOS 140 02/16/2017 0905   AST 87 (H) 02/16/2017 0905   ALT 145 (H) 02/16/2017 0905   BILITOT 0.39 02/16/2017 0905     EXAM: NUCLEAR MEDICINE PET WHOLE BODY  TECHNIQUE: 12.5 mCi F-18 FDG was injected intravenously. Full-ring PET imaging was performed from the vertex to the feet after the radiotracer. CT data was obtained and used for attenuation correction and anatomic localization.  FASTING BLOOD GLUCOSE:  Value: 99 mg/dl  COMPARISON:  Multiple exams, including 10/23/2016  FINDINGS: HEAD/NECK  The patient is known to have innumerable small metastatic deposits in the brain. Some of these are visible on the CT data as high density lesions. Due to the small size, the lesions are not as conspicuous on the PET data, and only seen as some subtle heterogeneity of metabolic activity for example in the cerebellum. No definite scalp or calvarial lesions are appreciated. No nodal or other involvement in the neck identified.  CHEST  Scattered pulmonary nodules in both lungs favor metastatic melanoma. An index 1.1 by 0.9 cm right upper lobe nodule on  image 17/7 has a maximum SUV of 3.9.  A right axillary lymph node measuring 7 mm in short axis on image 94/ 3 has a maximum SUV of 4.0, mildly accentuated.  ABDOMEN/PELVIS  A somewhat dense left adrenal mass measuring 1.1 by 2.0 cm on image 299/2 is hypermetabolic with maximum SUV 10.3. This lesion is smaller than the prior adrenal mass in this location, which measured 2.9 by 2.0 cm. Mildly hypermetabolic small pelvic sidewall lymph nodes. A somewhat high density left inguinal lymph node measuring 1.3 cm in short axis has a maximum SUV of 6.9, favoring malignancy.  A faint focus of activity posteriorly in the spleen has a maximum standard uptake value of 5.9. Other normal-appearing portions of the spleen have a maximum standard uptake value of about 4.3. A small splenic metastatic lesion is difficult to  totally exclude in this circumstance.  There is a cystic right adnexal lesion measuring 5.5 by 4.2 cm on image 203/3, this appears to be photopenic.  SKELETON  No focal hypermetabolic activity to suggest skeletal metastasis.  EXTREMITIES  Focal right antecubital activity is thought to be injection related as this is indeed the site of the injection.  Along the medial left upper thigh just below the perineum, there is an indistinct 1.3 cm subcutaneous nodule which appears hypermetabolic, maximum SUV 42.6. A similar lesion at the same vertical level long the right upper medial thigh measures 0.6 by 1.2 cm with a maximum SUV of 6.6. Given the presence of the subcutaneous nodularity I doubt that this is simply due to incontinence and contamination. The appearance is concerning for small cutaneous and subcutaneous metastatic lesions.  A subtle focus of activity in the left lateral posterior soleus region has a maximum SUV of only 3.1, and could be postinflammatory, less likely malignant.  IMPRESSION: 1. Metastatic melanoma involving the brain, lungs, left adrenal gland, left inguinal lymph node, bilateral medial upper thigh and cutaneous and subcutaneous tissues, and a right axillary lymph node. Possible involvement in the left lateral posterior soleus muscle in the lower leg, and possible involvement in the posterior spleen. The hypermetabolic right upper lobe pulmonary nodule measures 1.1 by 0.9 cm, and previously measured 1.9 by 1.5 cm on 10/23/2016. Other pulmonary nodules are similarly significantly reduced in size since that time. 2. Right ovarian cyst.   Impression and Plan:   31 year old woman with the following issues:  1. Metastatic melanoma of unknown primary. She presented with CNS metastasis as well as lung, ovarian and lymph node involvement. This is biopsy-proven to be metastatic melanoma with BRAF status is Positive.  She is currently on Dabrafenib +  Trametinib starting on 11/19/2016 and have tolerated it well.   PET CT scan obtained on 01/17/2017 was reviewed today and showed excellent response to therapy.  The plan is to continue with the same dose and schedule without any adjustments. We will repeat PET/CT scan in 2 months.   2. CNS metastasis:Radiation therapy concluded on 11/12/2016. She is currently on dexamethasone taper which she has tolerated with some side effects. The plan is to continue with the current taper protocol.  MRI obtained on 01/27/2017 showed good response to therapy.  3. Anxiety: Prescription for Ativan as been made available to the patient.  4. Prognosis: The goals of therapy were reviewed today which include palliative at this time. Her performance status remains adequate at this time.  5. Cardiac toxicity surveillance: Her baseline echocardiogram did show global hypokinesis with EF for close to 40%. I will  repeat echocardiogram before the next visit.  6. Follow-up: Will be in the next 4 weeks to follow-up on her progress.  [facility], MD 5/14/20189:39 AM

## 2017-02-19 MED FILL — ONDANSETRON ODT 4 MG TABLET: 4 | 10 days supply | Qty: 30 | Fill #1

## 2017-02-23 ENCOUNTER — Other Ambulatory Visit (HOSPITAL_COMMUNITY): Payer: Medicaid Other

## 2017-02-24 ENCOUNTER — Ambulatory Visit (HOSPITAL_COMMUNITY)
Admission: RE | Admit: 2017-02-24 | Discharge: 2017-02-24 | Disposition: A | Discharge status: Home / Self Care | Payer: Medicaid Other | Source: Ambulatory Visit | Attending: Oncology | Admitting: Oncology

## 2017-02-24 DIAGNOSIS — R001 Bradycardia, unspecified: Secondary | ICD-10-CM | POA: Insufficient documentation

## 2017-02-24 DIAGNOSIS — C439 Malignant melanoma of skin, unspecified: Secondary | ICD-10-CM

## 2017-02-24 DIAGNOSIS — I361 Nonrheumatic tricuspid (valve) insufficiency: Secondary | ICD-10-CM | POA: Insufficient documentation

## 2017-02-24 DIAGNOSIS — C799 Secondary malignant neoplasm of unspecified site: Secondary | ICD-10-CM

## 2017-02-24 NOTE — Progress Notes (Signed)
  Echocardiogram 2D Echocardiogram has been performed.  Donata Clay 02/24/2017, 12:13 PM

## 2017-02-25 ENCOUNTER — Telehealth: Payer: Self-pay | Admitting: *Deleted

## 2017-02-25 ENCOUNTER — Encounter: Payer: Self-pay | Admitting: *Deleted

## 2017-02-25 NOTE — Telephone Encounter (Signed)
Spoke with mother, dale. Per dr Alen Blew, continue to observe behavior, call back for any headaches, confusion or other symptoms.

## 2017-02-25 NOTE — Telephone Encounter (Signed)
Please let her know to continue to observe report any other issues. If she develops headaches, mental status changes, confusion or any other symptoms, she need to let us know.

## 2017-02-25 NOTE — Telephone Encounter (Signed)
Mother, dale calling. States patient is exhibiting strange behavior, hallucinating, hearing and seeing objects that are not there.

## 2017-02-27 MED FILL — $CYMBALTA 60 MG CAPSULE: 60 | 30 days supply | Qty: 30 | Fill #1

## 2017-03-16 ENCOUNTER — Other Ambulatory Visit (HOSPITAL_BASED_OUTPATIENT_CLINIC_OR_DEPARTMENT_OTHER): Payer: Medicaid Other

## 2017-03-16 ENCOUNTER — Telehealth: Payer: Self-pay | Admitting: Oncology

## 2017-03-16 ENCOUNTER — Ambulatory Visit (HOSPITAL_BASED_OUTPATIENT_CLINIC_OR_DEPARTMENT_OTHER): Payer: Medicaid Other | Admitting: Oncology

## 2017-03-16 ENCOUNTER — Ambulatory Visit (HOSPITAL_BASED_OUTPATIENT_CLINIC_OR_DEPARTMENT_OTHER): Payer: Medicaid Other

## 2017-03-16 VITALS — BP 121/65 | HR 104 | Temp 98.4°F | Resp 18 | Ht 68.0 in | Wt 210.9 lb

## 2017-03-16 DIAGNOSIS — C799 Secondary malignant neoplasm of unspecified site: Secondary | ICD-10-CM

## 2017-03-16 DIAGNOSIS — E039 Hypothyroidism, unspecified: Secondary | ICD-10-CM

## 2017-03-16 DIAGNOSIS — R001 Bradycardia, unspecified: Secondary | ICD-10-CM

## 2017-03-16 DIAGNOSIS — R5383 Other fatigue: Secondary | ICD-10-CM | POA: Diagnosis present

## 2017-03-16 DIAGNOSIS — C7931 Secondary malignant neoplasm of brain: Secondary | ICD-10-CM | POA: Diagnosis not present

## 2017-03-16 DIAGNOSIS — C7962 Secondary malignant neoplasm of left ovary: Secondary | ICD-10-CM | POA: Diagnosis not present

## 2017-03-16 DIAGNOSIS — C439 Malignant melanoma of skin, unspecified: Secondary | ICD-10-CM

## 2017-03-16 DIAGNOSIS — C7961 Secondary malignant neoplasm of right ovary: Secondary | ICD-10-CM | POA: Diagnosis not present

## 2017-03-16 LAB — CBC WITH DIFFERENTIAL/PLATELET
BASO%: 0.3 % (ref 0.0–2.0)
Basophils Absolute: 0 10*3/uL (ref 0.0–0.1)
EOS%: 2.5 % (ref 0.0–7.0)
Eosinophils Absolute: 0.1 10*3/uL (ref 0.0–0.5)
HCT: 38.9 % (ref 34.8–46.6)
HGB: 13.1 g/dL (ref 11.6–15.9)
LYMPH%: 28.3 % (ref 14.0–49.7)
MCH: 30.3 pg (ref 25.1–34.0)
MCHC: 33.7 g/dL (ref 31.5–36.0)
MCV: 90 fL (ref 79.5–101.0)
MONO#: 0.7 10*3/uL (ref 0.1–0.9)
MONO%: 20.8 % — AB (ref 0.0–14.0)
NEUT#: 1.6 10*3/uL (ref 1.5–6.5)
NEUT%: 48.1 % (ref 38.4–76.8)
PLATELETS: 160 10*3/uL (ref 145–400)
RBC: 4.32 10*6/uL (ref 3.70–5.45)
RDW: 13.1 % (ref 11.2–14.5)
WBC: 3.2 10*3/uL — ABNORMAL LOW (ref 3.9–10.3)
lymph#: 0.9 10*3/uL (ref 0.9–3.3)

## 2017-03-16 LAB — COMPREHENSIVE METABOLIC PANEL
ALT: 110 U/L — AB (ref 0–55)
ANION GAP: 11 meq/L (ref 3–11)
AST: 88 U/L — AB (ref 5–34)
Albumin: 3.2 g/dL — ABNORMAL LOW (ref 3.5–5.0)
Alkaline Phosphatase: 120 U/L (ref 40–150)
BUN: 12.4 mg/dL (ref 7.0–26.0)
CHLORIDE: 107 meq/L (ref 98–109)
CO2: 26 meq/L (ref 22–29)
CREATININE: 0.9 mg/dL (ref 0.6–1.1)
Calcium: 9.4 mg/dL (ref 8.4–10.4)
EGFR: 87 mL/min/{1.73_m2} — ABNORMAL LOW (ref >90)
Glucose: 95 mg/dl (ref 70–140)
Potassium: 3.8 mEq/L (ref 3.5–5.1)
Sodium: 144 mEq/L (ref 136–145)
Total Bilirubin: 0.49 mg/dL (ref 0.20–1.20)
Total Protein: 6.8 g/dL (ref 6.4–8.3)

## 2017-03-16 LAB — TSH: TSH: 2.112 m[IU]/L (ref 0.308–3.960)

## 2017-03-16 MED ORDER — LORAZEPAM 1 MG PO TABS: 1.0000 mg | ORAL_TABLET | Freq: Three times a day (TID) | ORAL | 0 refills | Status: AC | PRN

## 2017-03-16 MED ORDER — TRAMETINIB DIMETHYL SULFOXIDE 2 MG PO TABS
2.0000 mg | ORAL_TABLET | Freq: Every day | ORAL | 0 refills | Status: DC
Start: 2017-03-16 — End: 2017-03-16

## 2017-03-16 MED ORDER — DABRAFENIB MESYLATE 75 MG PO CAPS: 150.0000 mg | ORAL_CAPSULE | Freq: Two times a day (BID) | ORAL | 0 refills | Status: DC

## 2017-03-16 MED ORDER — DABRAFENIB MESYLATE 75 MG PO CAPS: 150.0000 mg | ORAL_CAPSULE | Freq: Two times a day (BID) | ORAL | 0 refills | Status: AC

## 2017-03-16 MED ORDER — METHOCARBAMOL 500 MG PO TABS: 500.0000 mg | ORAL_TABLET | Freq: Three times a day (TID) | ORAL | 0 refills | Status: AC | PRN

## 2017-03-16 MED ORDER — TRAMETINIB DIMETHYL SULFOXIDE 2 MG PO TABS: 2.0000 mg | ORAL_TABLET | Freq: Every day | ORAL | 0 refills | Status: AC

## 2017-03-16 MED ORDER — ONDANSETRON 8 MG PO TBDP: 4.0000 mg | ORAL_TABLET | Freq: Three times a day (TID) | ORAL | 3 refills | Status: AC | PRN

## 2017-03-16 MED FILL — TAFINLAR 75 MG CAPSULE: 75 | 30 days supply | Qty: 120 | Fill #0

## 2017-03-16 MED FILL — MEKINIST 2 MG TAB: 2 | 30 days supply | Qty: 30 | Fill #0

## 2017-03-16 MED FILL — ONDANSETRON ODT 8 MG TABLET: 8 | 20 days supply | Qty: 30 | Fill #0

## 2017-03-16 MED FILL — LORazepam 1 MG TABS: 1 | 10 days supply | Qty: 30 | Fill #0

## 2017-03-16 MED FILL — METHOCARBAMOL 500 MG TABLET: 500 | 10 days supply | Qty: 30 | Fill #0

## 2017-03-16 NOTE — Progress Notes (Signed)
Hematology and Oncology Follow Up Visit  Julia Luna [id number] 1986-03-02 31 y.o. 03/16/2017 10:50 AM Julia Luna, MDAmao, [name], MD   Principle Diagnosis: 31 year old with stage for melanoma diagnosed in January 2018. She presented with brain metastasis, lung lesions and bilateral ovarian masses. This is biopsy-proven to be melanoma of unknown primary. BRAF Status is positive.   Prior Therapy:   She is status post lymph node biopsy obtained on 10/24/2016.  Whole brain radiation therapy started on 10/31/2016. Ccompletion 11/12/2016.  Current therapy:   Dabrafinib 150 mg total dose twice a day with Trametinib 2 mg daily. Therapy started on 11/19/2016.  Interim History: Julia Luna presents today for a follow-up visit. Since the last visit, she reports few complaints and changes in her health. According to her mother, she has noticed overall decline in her energy and performance status. She is ambulating less and feels slightly unsteady when she does ambulate. She denied any falls or syncope. She denied any headaches or seizures. Her appetite is slightly down as she is tapering her steroid doses. She denied any diarrhea or hematochezia. She denied any dyspnea on exertion or shortness of breath.   She continues to take Dabrafinib 150 mg total dose twice a day with Trametinib 2 mg daily. Without any new complications. She denied any skin rashes or lesions. She denied any fevers. She denied any skin rashes or lesions. She denied any lower extremity edema or orthopnea.   She denied any falls or syncope. She does not report any fevers, chills, sweats or weight loss. She does not report any chest pain, palpitation, orthopnea or leg edema. She does not report any cough, wheezing or hemoptysis. She does not report any nausea, vomiting or abdominal pain. She does not report any frequency urgency or hesitancy. She does not report any skeletal complaints. Remaining review of systems  unremarkable.  Medications: I have reviewed the patient's current medications.  Current Outpatient Prescriptions  Medication Sig Dispense Refill  . butalbital-acetaminophen-caffeine (FIORICET, ESGIC) 50-325-40 MG tablet Take 1 tablet by mouth every 4 (four) hours as needed for headache. 14 tablet 0  . dabrafenib mesylate (TAFINLAR) 75 MG capsule Take 2 capsules (150 mg total) by mouth 2 (two) times daily. Take on an empty stomach 1 hour before or 2 hours after meals. 120 capsule 0  . dexamethasone (DECADRON) 4 MG tablet Take 1 tablet (4 mg total) by mouth 4 (four) times daily. (Patient taking differently: Take 4 mg by mouth 2 (two) times daily. Taking one tablet in the morning and 1/2 in the evening total of 6 mg daily) 120 tablet 0  . DULoxetine (CYMBALTA) 60 MG capsule Take 1 capsule (60 mg total) by mouth daily. 90 capsule 3  . esomeprazole (NEXIUM) 20 MG capsule Take 20 mg by mouth daily at 12 noon.    Julia Luna LORazepam (ATIVAN) 1 MG tablet Take 1 tablet (1 mg total) by mouth every 8 (eight) hours as needed. for anxiety 30 tablet 0  . meloxicam (MOBIC) 15 MG tablet Take 1 tablet (15 mg total) by mouth daily. 60 tablet 0  . methocarbamol (ROBAXIN) 500 MG tablet Take 1 tablet (500 mg total) by mouth every 8 (eight) hours as needed for muscle spasms. 30 tablet 0  . morphine (MSIR) 15 MG tablet Take 1 tablet (15 mg total) by mouth every 6 (six) hours as needed for severe pain. 60 tablet 0  . ondansetron (ZOFRAN-ODT) 8 MG disintegrating tablet Take 0.5 tablets (4 mg total) by mouth every  8 (eight) hours as needed for nausea or vomiting (place under tongue). 30 tablet 3  . polyethylene glycol (MIRALAX / GLYCOLAX) packet Take 17 g by mouth daily. 14 each 0  . prochlorperazine (COMPAZINE) 10 MG tablet Take 1 tablet (10 mg total) by mouth every 6 (six) hours as needed for nausea or vomiting. 30 tablet 1  . trametinib dimethyl sulfoxide (MEKINIST) 2 MG tablet Take 1 tablet (2 mg total) by mouth daily. Take 1  hour before or 2 hours after meals. Store refrigerated in original container. 30 tablet 0   No current facility-administered medications for this visit.      Allergies:  Allergies  Allergen Reactions  . Other Hives    NO "-CILLIN(s)"  . Penicillins Hives    Has patient had a PCN reaction causing immediate rash, facial/tongue/throat swelling, SOB or lightheadedness with hypotension: Yes Has patient had a PCN reaction causing severe rash involving mucus membranes or skin necrosis: No Has patient had a PCN reaction that required hospitalization: No Has patient had a PCN reaction occurring within the last 10 years: No If all of the above answers are "NO", then may proceed with Cephalosporin use.     Past Medical History, Surgical history, Social history, and Family History were reviewed and updated.  Physical Exam: Blood pressure 121/65, pulse (!) 104, temperature 98.4 F (36.9 C), temperature source Axillary, resp. rate 18, height '5\' 8"'$  (1.727 m), weight 210 lb 14.4 oz (95.7 kg), SpO2 99 %. ECOG: 1 General appearance: A comfortable-appearing woman without distress. Head: Normocephalic, without obvious abnormality no oral thrush or lesions. Neck: no adenopathy no masses noted. Lymph nodes: Cervical, supraclavicular, and axillary nodes normal. Heart:regular rate and rhythm, S1, S2 normal, no murmur, click, rub or gallop Lung:chest clear, no wheezing, rales, normal symmetric air entry Abdomin: soft, non-tender, without masses or organomegaly no shifting dullness or ascites. EXT:no erythema, induration, or nodules Neurological examination: No motor or sensory deficits noted. Ambulating without difficulties. Intact cranial nerves.  Lab Results: Lab Results  Component Value Date   WBC 3.2 (L) 03/16/2017   HGB 13.1 03/16/2017   HCT 38.9 03/16/2017   MCV 90.0 03/16/2017   PLT 160 03/16/2017     Chemistry      Component Value Date/Time   NA 144 03/16/2017 0945   K 3.8 03/16/2017  0945   CL 101 11/05/2016 1408   CO2 26 03/16/2017 0945   BUN 12.4 03/16/2017 0945   CREATININE 0.9 03/16/2017 0945      Component Value Date/Time   CALCIUM 9.4 03/16/2017 0945   ALKPHOS 120 03/16/2017 0945   AST 88 (H) 03/16/2017 0945   ALT 110 (H) 03/16/2017 0945   BILITOT 0.49 03/16/2017 0945     Left ventricle: The cavity size was mildly dilated. Systolic   function was moderately reduced. The estimated ejection fraction   was in the range of 35% to 40%. Diffuse hypokinesis. - Aortic valve: There was no regurgitation. - Mitral valve: Transvalvular velocity was within the normal range.   There was no evidence for stenosis. There was no regurgitation. - Right ventricle: The cavity size was normal. Wall thickness was   normal. Systolic function was normal. - Tricuspid valve: There was mild regurgitation. - Pulmonary arteries: Systolic pressure was within the normal   range. PA peak pressure: 24 mm Hg (S). - Global longitudinal strain -14.3%.  Impressions:  - No significant changes since echo 11/2016.    Impression and Plan:   31 year old woman with  the following issues:  1. Metastatic melanoma of unknown primary. She presented with CNS metastasis as well as lung, ovarian and lymph node involvement. This is biopsy-proven to be metastatic melanoma with BRAF status is Positive.  She is currently on Dabrafenib + Trametinib starting on 11/19/2016 and have tolerated it well.   PET CT scan obtained on 01/17/2017 showed excellent response to therapy.  Risks and benefits of continuing this medication was discussed with the patient and her mother today. There is a risk of developing resistance to this treatment in the immediate future. The likelihood she will need to be switched to combine immunotherapy very soon. The rationale for using combine immunotherapy in this particular setting utilizing ipilimumab and Nivolumab were discussed. Complications associated with these  medications were also reviewed today. These complications would include thyroid disease, pneumonitis and more importantly colitis.  The plan is to continue with oral targeted therapy for 1 more cycle and repeat a PET scan in 3 weeks. Depending on the results of the scan we might switch her to combine immunotherapy as she is at risk of developing relapse and progression of disease clinically.   2. CNS metastasis:Radiation therapy concluded on 11/12/2016. She is currently on dexamethasone taper which she has tolerated with some side effects. The plan is to continue with the current taper and repeat imaging studies in the future.  MRI obtained on 01/27/2017 showed good response to therapy.  3. Anxiety: Prescription for Ativan as been made available to the patient.  4. Prognosis: This continue to be addressed every visit. She understands treatments are palliative at this time. Her performance status remains excellent and aggressive therapy still warranted.  5. Cardiac toxicity surveillance: Her baseline echocardiogram did show global hypokinesis with EF for close to 40%. Repeat echocardiogram showed no major changes but continues to have global hypokinesis. I will refer her to cardiology for evaluation regarding these findings. She has no clinical signs or symptoms of congestive heart failure.  6. Thyroid function surveillance: We will check a slight thyroid function given her recent symptoms of excessive fatigue and lethargy.  7. Follow-up: Will be in the next 3 weeks to follow-up on her progress.  [email or web address],SMOLM, MD 6/11/201810:50 AM

## 2017-03-16 NOTE — Telephone Encounter (Signed)
Lab added for today,per 03/16/17 los. Lab and follow up with Dr Alen Blew scheduled for 04/06/17 @ 9, per 03/16/17 los. Patient was given a copy of the AVS report and appointment schedule, per 03/16/17 los.

## 2017-03-17 ENCOUNTER — Telehealth: Payer: Self-pay | Admitting: *Deleted

## 2017-03-17 LAB — VITAMIN D 25 HYDROXY (VIT D DEFICIENCY, FRACTURES): Vitamin D, 25-Hydroxy: 20.7 ng/mL — ABNORMAL LOW (ref 30.0–100.0)

## 2017-03-17 LAB — T4: Thyroxine (T4): 8.9 ug/dL (ref 4.5–12.0)

## 2017-03-17 LAB — T4, FREE: FREE T4: 1.22 ng/dL (ref 0.82–1.77)

## 2017-03-17 LAB — T3 UPTAKE
Free Thyroxine Index: 1.9 (ref 1.2–4.9)
T3 Uptake Ratio: 21 % — ABNORMAL LOW (ref 24–39)

## 2017-03-17 NOTE — Telephone Encounter (Signed)
-----   Message from Wyatt Portela, MD sent at 03/17/2017  7:58 AM EDT ----- Please let her know her thyroid is normal. Vitamin D is slightly low and could benefit from vit d supplement.  She can take over the counter daily supplement that will contain 2000 units.

## 2017-03-17 NOTE — Telephone Encounter (Signed)
As noted below by Dr. Alen Blew,  I left a message on mom's cell phone with lab results. Instructed her that Shakerria could take some OTC Vitamin D 2000 units/tablet. Instructed her to call [facility] if she had any questions or concerns.

## 2017-03-20 ENCOUNTER — Telehealth: Payer: Self-pay | Admitting: *Deleted

## 2017-03-20 MED FILL — $CYMBALTA 60 MG CAPSULE: 60 | 30 days supply | Qty: 30 | Fill #2

## 2017-03-20 NOTE — Telephone Encounter (Signed)
Received voice mail message from Quita Skye, mother, stating,"Julia Luna has gotten worse since she saw Dr. Alen Blew on Monday. She has several issues going on right now. First, she can't hold urine. She doesn't even know she is going until she feels the wetness on her legs. Second, she is having trouble walking. Third, she can't hold a focus. She is scattered. My return umber is [phone]."  Per Dr. Alen Blew, have the patient double the steroid dose. Per mom, she is taking half of a 4 mg tablet (2 mg). Starting today, she will take a 4 mg tablet once a day. If symptoms do not improve with increasing steroids, she is to take her to the ER to be assessed. Mom verbalized understanding.

## 2017-03-21 ENCOUNTER — Emergency Department (HOSPITAL_COMMUNITY): Payer: Medicaid Other

## 2017-03-21 ENCOUNTER — Encounter (HOSPITAL_COMMUNITY): Payer: Self-pay

## 2017-03-21 ENCOUNTER — Emergency Department (HOSPITAL_COMMUNITY)
Admission: EM | Admit: 2017-03-21 | Discharge: 2017-03-21 | Disposition: A | Discharge status: Home / Self Care | Payer: Medicaid Other | Attending: Emergency Medicine | Admitting: Emergency Medicine

## 2017-03-21 DIAGNOSIS — F17210 Nicotine dependence, cigarettes, uncomplicated: Secondary | ICD-10-CM | POA: Diagnosis not present

## 2017-03-21 DIAGNOSIS — Z79899 Other long term (current) drug therapy: Secondary | ICD-10-CM | POA: Diagnosis not present

## 2017-03-21 DIAGNOSIS — C7931 Secondary malignant neoplasm of brain: Secondary | ICD-10-CM | POA: Insufficient documentation

## 2017-03-21 DIAGNOSIS — R42 Dizziness and giddiness: Secondary | ICD-10-CM | POA: Insufficient documentation

## 2017-03-21 DIAGNOSIS — C439 Malignant melanoma of skin, unspecified: Secondary | ICD-10-CM | POA: Diagnosis not present

## 2017-03-21 LAB — COMPREHENSIVE METABOLIC PANEL
ALK PHOS: 107 U/L (ref 38–126)
ALT: 116 U/L — AB (ref 14–54)
ANION GAP: 8 (ref 5–15)
AST: 100 U/L — ABNORMAL HIGH (ref 15–41)
Albumin: 3.1 g/dL — ABNORMAL LOW (ref 3.5–5.0)
BILIRUBIN TOTAL: 0.6 mg/dL (ref 0.3–1.2)
BUN: 6 mg/dL (ref 6–20)
CALCIUM: 9 mg/dL (ref 8.9–10.3)
CO2: 26 mmol/L (ref 22–32)
CREATININE: 0.93 mg/dL (ref 0.44–1.00)
Chloride: 102 mmol/L (ref 101–111)
Glucose, Bld: 135 mg/dL — ABNORMAL HIGH (ref 65–99)
Potassium: 3.3 mmol/L — ABNORMAL LOW (ref 3.5–5.1)
SODIUM: 136 mmol/L (ref 135–145)
TOTAL PROTEIN: 6.4 g/dL — AB (ref 6.5–8.1)

## 2017-03-21 LAB — I-STAT CG4 LACTIC ACID, ED
LACTIC ACID, VENOUS: 1.11 mmol/L (ref 0.5–1.9)
Lactic Acid, Venous: 2.22 mmol/L (ref 0.5–1.9)

## 2017-03-21 LAB — CBC WITH DIFFERENTIAL/PLATELET
BASOS PCT: 0 %
Basophils Absolute: 0 10*3/uL (ref 0.0–0.1)
EOS ABS: 0.1 10*3/uL (ref 0.0–0.7)
Eosinophils Relative: 2 %
HEMATOCRIT: 34.9 % — AB (ref 36.0–46.0)
HEMOGLOBIN: 11.9 g/dL — AB (ref 12.0–15.0)
Lymphocytes Relative: 20 %
Lymphs Abs: 0.6 10*3/uL — ABNORMAL LOW (ref 0.7–4.0)
MCH: 29.9 pg (ref 26.0–34.0)
MCHC: 34.1 g/dL (ref 30.0–36.0)
MCV: 87.7 fL (ref 78.0–100.0)
Monocytes Absolute: 0.5 10*3/uL (ref 0.1–1.0)
Monocytes Relative: 17 %
NEUTROS ABS: 1.7 10*3/uL (ref 1.7–7.7)
NEUTROS PCT: 61 %
Platelets: 147 10*3/uL — ABNORMAL LOW (ref 150–400)
RBC: 3.98 MIL/uL (ref 3.87–5.11)
RDW: 12.9 % (ref 11.5–15.5)
WBC: 2.9 10*3/uL — AB (ref 4.0–10.5)

## 2017-03-21 LAB — I-STAT CHEM 8, ED
BUN: 5 mg/dL — ABNORMAL LOW (ref 6–20)
CALCIUM ION: 1.1 mmol/L — AB (ref 1.15–1.40)
CHLORIDE: 102 mmol/L (ref 101–111)
Creatinine, Ser: 0.8 mg/dL (ref 0.44–1.00)
GLUCOSE: 104 mg/dL — AB (ref 65–99)
HCT: 29 % — ABNORMAL LOW (ref 36.0–46.0)
HEMOGLOBIN: 9.9 g/dL — AB (ref 12.0–15.0)
POTASSIUM: 3.8 mmol/L (ref 3.5–5.1)
Sodium: 139 mmol/L (ref 135–145)
TCO2: 27 mmol/L (ref 0–100)

## 2017-03-21 LAB — I-STAT BETA HCG BLOOD, ED (MC, WL, AP ONLY)

## 2017-03-21 MED ORDER — MELOXICAM 15 MG PO TABS: 15.0000 mg | ORAL_TABLET | Freq: Every day | ORAL | 0 refills | Status: AC

## 2017-03-21 MED ORDER — SODIUM CHLORIDE 0.9 % IV BOLUS (SEPSIS)
1000.0000 mL | Freq: Once | INTRAVENOUS | Status: AC
  Administered 2017-03-21: 1000 mL via INTRAVENOUS

## 2017-03-21 MED ORDER — DEXAMETHASONE SODIUM PHOSPHATE 10 MG/ML IJ SOLN
10.0000 mg | Freq: Once | INTRAMUSCULAR | Status: AC
  Administered 2017-03-21: 10 mg via INTRAVENOUS
  Filled 2017-03-21: qty 1

## 2017-03-21 MED ORDER — IOPAMIDOL (ISOVUE-300) INJECTION 61%
INTRAVENOUS | Status: AC
  Administered 2017-03-21: 75 mL via INTRAVENOUS
  Filled 2017-03-21: qty 75

## 2017-03-21 MED ORDER — ONDANSETRON HCL 4 MG/2ML IJ SOLN: 4.0000 mg | Freq: Once | INTRAMUSCULAR | Status: DC

## 2017-03-21 NOTE — ED Triage Notes (Signed)
Pt reports she has stage four melanoma cancer with metastasis to the brain. She is taking oral chemotherapy. She and her mother reports worsening condition: weakness, trouble eating,rouble getting out of bed, urinary control issues, falling, walking, concentrating and she reports pain to left leg when she gets up to walk.

## 2017-03-21 NOTE — ED Notes (Signed)
Pt given a mask and encouraged to wear it while she is here in the ED.

## 2017-03-21 NOTE — Discharge Instructions (Signed)
Increase decadron to 4 mg Twice daily.   Take your other meds as prescribed.   See Dr. Alen Blew next week   Return to ER if you have worse dizziness, headaches, lethargy, confusion, vomiting, weakness, numbness

## 2017-03-21 NOTE — ED Notes (Addendum)
Lactic acid 2.2 Nurse first notified

## 2017-03-21 NOTE — ED Provider Notes (Signed)
[facility] DEPT Provider Note   CSN: [id number] Arrival date & time: 03/21/17  1418     History   Chief Complaint Chief Complaint  Patient presents with  . Melanoma    HPI Julia Luna is a 31 y.o. 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 Patient was diagnosed with melanoma and January of this year with presentation of dizziness. Patient has been seen Dr. Alen Blew from oncology and has been on oral meds. For the last week or so she has been more dizzy and confused. She sometimes has trouble coming up with words and has been very forgetful.  About a week ago she had an episode of urinary incontinence.  Patient had a recent MRI that showed metastases to her brain with some edema. Her Decadron was increased from 2 mg to 4 mg   but her symptoms has not improved. Denies fever or chills or cough. Has been having poor appetite and some vomiting but able to keep things down today.    The history is provided by the patient.    Past Medical History:  Diagnosis Date  . Fibromyalgia   . Malignant melanoma metastatic to brain ([place]) 10/2016  . Osteoarthritis   . Skin cancer    melanoma    Patient Active Problem List   Diagnosis Date Noted  . Goals of care, counseling/discussion 11/03/2016  . Lung metastases ([place]) 10/29/2016  . Metastatic melanoma ([place]) 10/29/2016  . Brain metastases ([place]) 10/25/2016  . Fibromyalgia 10/25/2016  . Unresponsive episode 10/25/2016  . Headache 10/25/2016  . Acute encephalopathy 10/25/2016  . Brain mass 10/23/2016  . Hypokalemia 10/23/2016  . Bradycardia 10/23/2016  . Tobacco use disorder 10/23/2016    Past Surgical History:  Procedure Laterality Date  . CESAREAN SECTION    . CHOLECYSTECTOMY      OB History    No data available       Home Medications    Prior to Admission medications   Medication Sig Start Date End Date Taking?  Authorizing Provider  Cholecalciferol (D3 HIGH POTENCY) 2000 units CAPS Take 2,000 Units by mouth daily.   Yes [provider]  dabrafenib mesylate (TAFINLAR) 75 MG capsule Take 2 capsules (150 mg total) by mouth 2 (two) times daily. Take on an empty stomach 1 hour before or 2 hours after meals. 03/16/17  Yes Wyatt Portela, MD  dexamethasone (DECADRON) 4 MG tablet Take 1 tablet (4 mg total) by mouth 4 (four) times daily. Patient taking differently: Take 2 mg by mouth 2 (two) times daily.  12/08/16  Yes Wyatt Portela, MD  DULoxetine (CYMBALTA) 60 MG capsule Take 1 capsule (60 mg total) by mouth daily. 12/31/16  Yes [name], MD  esomeprazole (NEXIUM) 20 MG capsule Take 20 mg by mouth daily at 12 noon.   Yes [provider]  LORazepam (ATIVAN) 1 MG tablet Take 1 tablet (1 mg total) by mouth every 8 (eight) hours as needed. for anxiety 03/16/17  Yes Shadad, Mathis Dad, MD  meloxicam (MOBIC) 15 MG tablet Take 1 tablet (15 mg total) by mouth daily. 02/11/17  Yes Shadad, Mathis Dad, MD  Misc Natural Products (COSAMIN ASU ADVANCED FORMULA PO) Take 2 tablets by mouth daily.   Yes [provider]  ondansetron (ZOFRAN-ODT) 8 MG disintegrating tablet Take 0.5 tablets (4 mg total) by mouth every 8 (eight) hours as needed for nausea or vomiting (place under tongue).  Patient taking differently: Take 8 mg by mouth every 8 (eight) hours as needed for nausea or vomiting (place under tongue).  03/16/17  Yes Wyatt Portela, MD  polyethylene glycol (MIRALAX / GLYCOLAX) packet Take 17 g by mouth daily. Patient taking differently: Take 17 g by mouth daily as needed for mild constipation.  10/28/16  Yes Lavina Hamman, MD  trametinib dimethyl sulfoxide (MEKINIST) 2 MG tablet Take 1 tablet (2 mg total) by mouth daily. Take 1 hour before or 2 hours after meals. Store refrigerated in original container. 03/16/17  Yes Wyatt Portela, MD  butalbital-acetaminophen-caffeine (FIORICET, ESGIC) [phone] MG  tablet Take 1 tablet by mouth every 4 (four) hours as needed for headache. 10/28/16   Lavina Hamman, MD  methocarbamol (ROBAXIN) 500 MG tablet Take 1 tablet (500 mg total) by mouth every 8 (eight) hours as needed for muscle spasms. 03/16/17   Wyatt Portela, MD  morphine (MSIR) 15 MG tablet Take 1 tablet (15 mg total) by mouth every 6 (six) hours as needed for severe pain. Patient not taking: Reported on 03/21/2017 11/12/16   Wyatt Portela, MD  prochlorperazine (COMPAZINE) 10 MG tablet Take 1 tablet (10 mg total) by mouth every 6 (six) hours as needed for nausea or vomiting. Patient not taking: Reported on 03/21/2017 11/24/16   Wyatt Portela, MD    Family History Family History  Problem Relation Age of Onset  . Thyroid cancer Mother   . Cardiomyopathy Father     Social History Social History  Substance Use Topics  . Smoking status: Current Every Day Smoker    Packs/day: 0.75    Years: 10.00    Types: Cigarettes  . Smokeless tobacco: Never Used  . Alcohol use Yes     Comment: Socially      Allergies   Other and Penicillins   Review of Systems Review of Systems  Neurological: Positive for dizziness and weakness.  All other systems reviewed and are negative.    Physical Exam Updated Vital Signs BP 104/61   Pulse 65   Temp 97.7 F (36.5 C) (Oral)   Resp 15   Ht 5\' 5"  (1.651 m)   Wt 95.3 kg (210 lb)   SpO2 98%   BMI 34.95 kg/m   Physical Exam  Constitutional: She is oriented to person, place, and time.  Chronically ill   HENT:  Head: Normocephalic.  MM slightly dry   Eyes: Conjunctivae and EOM are normal. Pupils are equal, round, and reactive to light.  Neck: Normal range of motion. Neck supple.  Cardiovascular: Normal rate, regular rhythm and normal heart sounds.   Pulmonary/Chest: Effort normal and breath sounds normal. No respiratory distress. She has no wheezes. She has no rales.  Abdominal: Soft. Bowel sounds are normal. She exhibits no distension. There  is no tenderness.  Musculoskeletal: Normal range of motion.  Neurological: She is alert and oriented to person, place, and time.  Cn 2-12 intact. Nl strength throughout. Mild dysmetria L arm. Able to ambulate but prefers R side   Skin: Skin is warm.  Psychiatric: She has a normal mood and affect.  Nursing note and vitals reviewed.    ED Treatments / Results  Labs (all labs ordered are listed, but only abnormal results are displayed) Labs Reviewed  COMPREHENSIVE METABOLIC PANEL - Abnormal; Notable for the following:       Result Value   Potassium 3.3 (*)    Glucose, Bld 135 (*)  Total Protein 6.4 (*)    Albumin 3.1 (*)    AST 100 (*)    ALT 116 (*)    All other components within normal limits  CBC WITH DIFFERENTIAL/PLATELET - Abnormal; Notable for the following:    WBC 2.9 (*)    Hemoglobin 11.9 (*)    HCT 34.9 (*)    Platelets 147 (*)    Lymphs Abs 0.6 (*)    All other components within normal limits  I-STAT CG4 LACTIC ACID, ED - Abnormal; Notable for the following:    Lactic Acid, Venous 2.22 (*)    All other components within normal limits  I-STAT CHEM 8, ED - Abnormal; Notable for the following:    BUN 5 (*)    Glucose, Bld 104 (*)    Calcium, Ion 1.10 (*)    Hemoglobin 9.9 (*)    HCT 29.0 (*)    All other components within normal limits  URINALYSIS, ROUTINE W REFLEX MICROSCOPIC  I-STAT BETA HCG BLOOD, ED ([facility], [facility], AP ONLY)  I-STAT CG4 LACTIC ACID, ED    EKG  EKG Interpretation None       Radiology Dg Chest 2 View  Result Date: 03/21/2017 CLINICAL DATA:  History of melanoma. EXAM: CHEST  2 VIEW COMPARISON:  PET-CT 01/27/2017 FINDINGS: Known small right upper lobe metastasis, measuring 1 cm today. Other pulmonary nodules are not visible. There is no edema, consolidation, effusion, or pneumothorax. Normal heart size and mediastinal contours. No acute osseous finding. Cholecystectomy clips. IMPRESSION: 1. No acute finding. 2. Known small pulmonary metastases.  Electronically Signed   By: Monte Fantasia M.D.   On: 03/21/2017 15:50   Ct Head W Or Wo Contrast  Result Date: 03/21/2017 CLINICAL DATA:  Known melanoma with metastases. Worsening ataxia. Weakness. EXAM: CT HEAD WITHOUT AND WITH CONTRAST TECHNIQUE: Contiguous axial images were obtained from the base of the skull through the vertex without and with intravenous contrast CONTRAST:  45mL ISOVUE-300 IOPAMIDOL (ISOVUE-300) INJECTION 61% COMPARISON:  Brain MRI 01/17/2017 FINDINGS: Brain: Innumerable hyperattenuating cerebral, brainstem, and cerebellar metastases, even more numerous by MRI. The overall pattern is stable from prior. No suspected growing lesion or new edema. The largest deposit is in the anterior right frontal cortex measuring up to 18 mm. These are high density either from hemorrhage or melanin. No superimposed infarct, hemorrhage, hydrocephalus, or shift. Chronic 15 mm cystic structure in the right temporal pole. Vascular: No hyperdense vessel or unexpected calcification. Visible vessels are patent. Skull: Negative Sinuses/Orbits: Negative IMPRESSION: Known innumerable brain metastases. No detected change from brain MRI 01/17/2017. No acute superimposed finding. Electronically Signed   By: Monte Fantasia M.D.   On: 03/21/2017 18:58    Procedures Procedures (including critical care time)  Medications Ordered in ED Medications  ondansetron (ZOFRAN) injection 4 mg (4 mg Intravenous Not Given 03/21/17 1715)  sodium chloride 0.9 % bolus 1,000 mL (0 mLs Intravenous Stopped 03/21/17 1916)  dexamethasone (DECADRON) injection 10 mg (10 mg Intravenous Given 03/21/17 1715)  iopamidol (ISOVUE-300) 61 % injection (75 mLs Intravenous Contrast Given 03/21/17 1743)     Initial Impression / Assessment and Plan / ED Course  I have reviewed the triage vital signs and the nursing notes.  Pertinent labs & imaging results that were available during my care of the patient were reviewed by me and considered in  my medical decision making (see chart for details).     Julia Luna is a 31 y.o. female here with weakness, dizziness, incontinence.  Has brain mets from melanoma. Will get labs, CT head w/wo to look for increased edema vs mets. Will hydrate patient and give decadron.   7:33 PM Labs showed lactate 2.2 (ordered in triage). Repeat lactate after fluids is 1.1. WBC 2.9. CXR clear. CT head w/wo showed stable mets with no worsening edema. I given her decadron. I called Dr. Alen Blew, who knows her well. He recommend increase decadron to 4 mg BID. He will have patient follow up with him next week    Final Clinical Impressions(s) / ED Diagnoses   Final diagnoses:  None    New Prescriptions New Prescriptions   No medications on file     Drenda Freeze, MD 03/21/17 Joen Laura

## 2017-03-21 NOTE — ED Notes (Addendum)
Pt. Advised that she needs urine specimen. Pt. States incontinence and unable to get sample.

## 2017-04-02 ENCOUNTER — Encounter: Payer: Self-pay | Admitting: *Deleted

## 2017-04-02 ENCOUNTER — Telehealth: Payer: Self-pay | Admitting: *Deleted

## 2017-04-02 NOTE — Telephone Encounter (Signed)
Spoke with patient's mother dale, appt for palliative consult moved to 04/06/17 at 10:00 after appt with dr Alen Blew @ 9:00. Nurse to escort them to radiation oncology, where [facility] is located. PET scan was denied and will need dr Alen Blew to do a peer review. Pending.

## 2017-04-06 ENCOUNTER — Emergency Department (HOSPITAL_COMMUNITY)
Admission: EM | Admit: 2017-04-06 | Discharge: 2017-04-07 | Disposition: A | Discharge status: Hospice | Payer: Medicaid Other | Attending: Emergency Medicine | Admitting: Emergency Medicine

## 2017-04-06 ENCOUNTER — Telehealth: Payer: Self-pay | Admitting: *Deleted

## 2017-04-06 ENCOUNTER — Ambulatory Visit (HOSPITAL_BASED_OUTPATIENT_CLINIC_OR_DEPARTMENT_OTHER): Payer: Medicaid Other | Admitting: Oncology

## 2017-04-06 ENCOUNTER — Other Ambulatory Visit (HOSPITAL_BASED_OUTPATIENT_CLINIC_OR_DEPARTMENT_OTHER): Payer: Medicaid Other

## 2017-04-06 ENCOUNTER — Encounter (HOSPITAL_COMMUNITY): Payer: Self-pay | Admitting: Emergency Medicine

## 2017-04-06 ENCOUNTER — Ambulatory Visit
Admission: RE | Admit: 2017-04-06 | Discharge: 2017-04-06 | Disposition: A | Discharge status: Home / Self Care | Payer: Medicaid Other | Source: Ambulatory Visit | Attending: Internal Medicine | Admitting: Internal Medicine

## 2017-04-06 VITALS — BP 116/84 | HR 105 | Resp 20 | Ht 65.0 in

## 2017-04-06 DIAGNOSIS — Z66 Do not resuscitate: Secondary | ICD-10-CM

## 2017-04-06 DIAGNOSIS — Z79899 Other long term (current) drug therapy: Secondary | ICD-10-CM | POA: Diagnosis not present

## 2017-04-06 DIAGNOSIS — E039 Hypothyroidism, unspecified: Secondary | ICD-10-CM

## 2017-04-06 DIAGNOSIS — C7960 Secondary malignant neoplasm of unspecified ovary: Secondary | ICD-10-CM

## 2017-04-06 DIAGNOSIS — C7800 Secondary malignant neoplasm of unspecified lung: Secondary | ICD-10-CM

## 2017-04-06 DIAGNOSIS — C7931 Secondary malignant neoplasm of brain: Secondary | ICD-10-CM

## 2017-04-06 DIAGNOSIS — C439 Malignant melanoma of skin, unspecified: Secondary | ICD-10-CM

## 2017-04-06 DIAGNOSIS — Z880 Allergy status to penicillin: Secondary | ICD-10-CM | POA: Diagnosis not present

## 2017-04-06 DIAGNOSIS — C799 Secondary malignant neoplasm of unspecified site: Secondary | ICD-10-CM

## 2017-04-06 DIAGNOSIS — R569 Unspecified convulsions: Secondary | ICD-10-CM | POA: Diagnosis not present

## 2017-04-06 DIAGNOSIS — C779 Secondary and unspecified malignant neoplasm of lymph node, unspecified: Secondary | ICD-10-CM

## 2017-04-06 DIAGNOSIS — Z515 Encounter for palliative care: Secondary | ICD-10-CM | POA: Diagnosis not present

## 2017-04-06 DIAGNOSIS — F17210 Nicotine dependence, cigarettes, uncomplicated: Secondary | ICD-10-CM | POA: Diagnosis not present

## 2017-04-06 LAB — CBC WITH DIFFERENTIAL/PLATELET
BASO%: 1.1 % (ref 0.0–2.0)
Basophils Absolute: 0 10*3/uL (ref 0.0–0.1)
EOS%: 2 % (ref 0.0–7.0)
Eosinophils Absolute: 0.1 10*3/uL (ref 0.0–0.5)
HCT: 43.7 % (ref 34.8–46.6)
HGB: 15 g/dL (ref 11.6–15.9)
LYMPH%: 26.8 % (ref 14.0–49.7)
MCH: 30.1 pg (ref 25.1–34.0)
MCHC: 34.3 g/dL (ref 31.5–36.0)
MCV: 87.6 fL (ref 79.5–101.0)
MONO#: 0.6 10*3/uL (ref 0.1–0.9)
MONO%: 15.7 % — AB (ref 0.0–14.0)
NEUT%: 54.4 % (ref 38.4–76.8)
NEUTROS ABS: 2.2 10*3/uL (ref 1.5–6.5)
Platelets: 171 10*3/uL (ref 145–400)
RBC: 4.99 10*6/uL (ref 3.70–5.45)
RDW: 14.2 % (ref 11.2–14.5)
WBC: 4.1 10*3/uL (ref 3.9–10.3)
lymph#: 1.1 10*3/uL (ref 0.9–3.3)

## 2017-04-06 LAB — CBC
HEMATOCRIT: 39.8 % (ref 36.0–46.0)
Hemoglobin: 13.8 g/dL (ref 12.0–15.0)
MCH: 30.8 pg (ref 26.0–34.0)
MCHC: 34.7 g/dL (ref 30.0–36.0)
MCV: 88.8 fL (ref 78.0–100.0)
PLATELETS: 133 10*3/uL — AB (ref 150–400)
RBC: 4.48 MIL/uL (ref 3.87–5.11)
RDW: 13.6 % (ref 11.5–15.5)
WBC: 3.1 10*3/uL — ABNORMAL LOW (ref 4.0–10.5)

## 2017-04-06 LAB — COMPREHENSIVE METABOLIC PANEL
ALT: 60 U/L — AB (ref 0–55)
AST: 37 U/L — AB (ref 5–34)
Albumin: 3.5 g/dL (ref 3.5–5.0)
Alkaline Phosphatase: 105 U/L (ref 40–150)
Anion Gap: 13 mEq/L — ABNORMAL HIGH (ref 3–11)
BILIRUBIN TOTAL: 0.57 mg/dL (ref 0.20–1.20)
BUN: 17.9 mg/dL (ref 7.0–26.0)
CHLORIDE: 107 meq/L (ref 98–109)
CO2: 24 meq/L (ref 22–29)
CREATININE: 0.9 mg/dL (ref 0.6–1.1)
Calcium: 9.6 mg/dL (ref 8.4–10.4)
EGFR: 86 mL/min/{1.73_m2} — ABNORMAL LOW (ref >90)
GLUCOSE: 97 mg/dL (ref 70–140)
Potassium: 3.8 mEq/L (ref 3.5–5.1)
SODIUM: 143 meq/L (ref 136–145)
TOTAL PROTEIN: 7.2 g/dL (ref 6.4–8.3)

## 2017-04-06 LAB — BASIC METABOLIC PANEL
Anion gap: 12 (ref 5–15)
BUN: 16 mg/dL (ref 6–20)
CHLORIDE: 106 mmol/L (ref 101–111)
CO2: 22 mmol/L (ref 22–32)
CREATININE: 0.84 mg/dL (ref 0.44–1.00)
Calcium: 9.4 mg/dL (ref 8.9–10.3)
GFR calc Af Amer: >60 mL/min (ref >60)
GFR calc non Af Amer: >60 mL/min (ref >60)
Glucose, Bld: 109 mg/dL — ABNORMAL HIGH (ref 65–99)
POTASSIUM: 3.6 mmol/L (ref 3.5–5.1)
Sodium: 140 mmol/L (ref 135–145)

## 2017-04-06 LAB — CBG MONITORING, ED: Glucose-Capillary: 107 mg/dL — ABNORMAL HIGH (ref 65–99)

## 2017-04-06 MED ORDER — LORAZEPAM 2 MG/ML IJ SOLN
1.0000 mg | Freq: Once | INTRAMUSCULAR | Status: AC
  Administered 2017-04-06: 1 mg via INTRAVENOUS
  Filled 2017-04-06: qty 1

## 2017-04-06 MED ORDER — SODIUM CHLORIDE 0.9 % IV SOLN
1000.0000 mg | Freq: Once | INTRAVENOUS | Status: AC
  Administered 2017-04-06: 1000 mg via INTRAVENOUS
  Filled 2017-04-06: qty 10

## 2017-04-06 NOTE — ED Notes (Signed)
ED Provider at bedside. 

## 2017-04-06 NOTE — Telephone Encounter (Signed)
Per Wadie Lessen NP palliative care team, patient's parents have decided to go with hospice of [facility]. Dr Alen Blew to be the attending, hospice physicians may do symptom management and sign the DNR

## 2017-04-06 NOTE — Progress Notes (Signed)
Hematology and Oncology Follow Up Visit  Julia Luna [id number] 04/28/1986 31 y.o. 04/06/2017 9:19 AM Julia Luna, MDAmao, [name], MD   Principle Diagnosis: 31 year old with stage for melanoma diagnosed in January 2018. She presented with brain metastasis, lung lesions and bilateral ovarian masses. This is biopsy-proven to be melanoma of unknown primary. BRAF Status is positive.   Prior Therapy:   She is status post lymph node biopsy obtained on 10/24/2016.  Whole brain radiation therapy started on 10/31/2016. Ccompletion 11/12/2016.  Current therapy:   Dabrafinib 150 mg total dose twice a day with Trametinib 2 mg daily. Therapy started on 11/19/2016.  Interim History: Ms. Dicarlo presents today for a follow-up visit with her parents. Since the last visit, she reports continuous decline in her overall health. She has been experiencing progressive weakness predominantly on the left side. This had been on dexamethasone twice a day which showed some improvement in her symptoms but have rapidly declined in the last few weeks. She was able to travel to [place] and temporarily had a reasonable trip including swimming with dolphins. Her appetite has been poor able to eat at times. She is wheelchair-bound with overall debilitation. She is having a hard time swallowing pills at this time. She had multiple falls and no seizures.   She does not report any fevers, chills, sweats or weight loss. She does not report any chest pain, palpitation, orthopnea or leg edema. She does not report any cough, wheezing or hemoptysis. She does not report any nausea, vomiting or abdominal pain. She does not report any frequency urgency or hesitancy. She does not report any skeletal complaints. Remaining review of systems unremarkable.  Medications: I have reviewed the patient's current medications.  Current Outpatient Prescriptions  Medication Sig Dispense Refill  . butalbital-acetaminophen-caffeine (FIORICET,  ESGIC) 50-325-40 MG tablet Take 1 tablet by mouth every 4 (four) hours as needed for headache. 14 tablet 0  . Cholecalciferol (D3 HIGH POTENCY) 2000 units CAPS Take 2,000 Units by mouth daily.    [name] dabrafenib mesylate (TAFINLAR) 75 MG capsule Take 2 capsules (150 mg total) by mouth 2 (two) times daily. Take on an empty stomach 1 hour before or 2 hours after meals. 120 capsule 0  . dexamethasone (DECADRON) 4 MG tablet Take 1 tablet (4 mg total) by mouth 4 (four) times daily. (Patient taking differently: Take 2 mg by mouth 2 (two) times daily. ) 120 tablet 0  . DULoxetine (CYMBALTA) 60 MG capsule Take 1 capsule (60 mg total) by mouth daily. 90 capsule 3  . esomeprazole (NEXIUM) 20 MG capsule Take 20 mg by mouth daily at 12 noon.    [name] LORazepam (ATIVAN) 1 MG tablet Take 1 tablet (1 mg total) by mouth every 8 (eight) hours as needed. for anxiety 30 tablet 0  . meloxicam (MOBIC) 15 MG tablet Take 1 tablet (15 mg total) by mouth daily. 60 tablet 0  . methocarbamol (ROBAXIN) 500 MG tablet Take 1 tablet (500 mg total) by mouth every 8 (eight) hours as needed for muscle spasms. 30 tablet 0  . Misc Natural Products (COSAMIN ASU ADVANCED FORMULA PO) Take 2 tablets by mouth daily.    [name] morphine (MSIR) 15 MG tablet Take 1 tablet (15 mg total) by mouth every 6 (six) hours as needed for severe pain. (Patient not taking: Reported on 03/21/2017) 60 tablet 0  . ondansetron (ZOFRAN-ODT) 8 MG disintegrating tablet Take 0.5 tablets (4 mg total) by mouth every 8 (eight) hours as needed for nausea or  vomiting (place under tongue). (Patient taking differently: Take 8 mg by mouth every 8 (eight) hours as needed for nausea or vomiting (place under tongue). ) 30 tablet 3  . polyethylene glycol (MIRALAX / GLYCOLAX) packet Take 17 g by mouth daily. (Patient taking differently: Take 17 g by mouth daily as needed for mild constipation. ) 14 each 0  . prochlorperazine (COMPAZINE) 10 MG tablet Take 1 tablet (10 mg total) by mouth every  6 (six) hours as needed for nausea or vomiting. (Patient not taking: Reported on 03/21/2017) 30 tablet 1  . trametinib dimethyl sulfoxide (MEKINIST) 2 MG tablet Take 1 tablet (2 mg total) by mouth daily. Take 1 hour before or 2 hours after meals. Store refrigerated in original container. 30 tablet 0   No current facility-administered medications for this visit.      Allergies:  Allergies  Allergen Reactions  . Other Hives    NO "-CILLIN(s)"  . Penicillins Hives    Has patient had a PCN reaction causing immediate rash, facial/tongue/throat swelling, SOB or lightheadedness with hypotension: Yes Has patient had a PCN reaction causing severe rash involving mucus membranes or skin necrosis: No Has patient had a PCN reaction that required hospitalization: No Has patient had a PCN reaction occurring within the last 10 years: No If all of the above answers are "NO", then may proceed with Cephalosporin use.     Past Medical History, Surgical history, Social history, and Family History were reviewed and updated.  Physical Exam: Blood pressure 116/84, pulse (!) 105, resp. rate 20, height '5\' 5"'$  (1.651 m), SpO2 99 %. ECOG: 1 General appearance: Awake, alert but nonverbal. In wheelchair. Head: Normocephalic, without obvious abnormality no oral thrush or lesions. Neck: no adenopathy no masses noted. Lymph nodes: Cervical, supraclavicular, and axillary nodes normal. Heart:regular rate and rhythm, S1, S2 normal, no murmur, click, rub or gallop Lung:chest clear, no wheezing, rales, normal symmetric air entry Abdomin: soft, non-tender, without masses or organomegaly no shifting dullness or ascites. EXT:no erythema, induration, or nodules Neurological examination: Weakness noted on the left side.  Lab Results: Lab Results  Component Value Date   WBC 4.1 04/06/2017   HGB 15.0 04/06/2017   HCT 43.7 04/06/2017   MCV 87.6 04/06/2017   PLT 171 04/06/2017     Chemistry      Component Value  Date/Time   NA 139 03/21/2017 1834   NA 144 03/16/2017 0945   K 3.8 03/21/2017 1834   K 3.8 03/16/2017 0945   CL 102 03/21/2017 1834   CO2 26 03/21/2017 1454   CO2 26 03/16/2017 0945   BUN 5 (L) 03/21/2017 1834   BUN 12.4 03/16/2017 0945   CREATININE 0.80 03/21/2017 1834   CREATININE 0.9 03/16/2017 0945      Component Value Date/Time   CALCIUM 9.0 03/21/2017 1454   CALCIUM 9.4 03/16/2017 0945   ALKPHOS 107 03/21/2017 1454   ALKPHOS 120 03/16/2017 0945   AST 100 (H) 03/21/2017 1454   AST 88 (H) 03/16/2017 0945   ALT 116 (H) 03/21/2017 1454   ALT 110 (H) 03/16/2017 0945   BILITOT 0.6 03/21/2017 1454   BILITOT 0.49 03/16/2017 0945      EXAM: CT HEAD WITHOUT AND WITH CONTRAST  TECHNIQUE: Contiguous axial images were obtained from the base of the skull through the vertex without and with intravenous contrast  CONTRAST:  41m ISOVUE-300 IOPAMIDOL (ISOVUE-300) INJECTION 61%  COMPARISON:  Brain MRI 01/17/2017  FINDINGS: Brain: Innumerable hyperattenuating cerebral, brainstem, and cerebellar  metastases, even more numerous by MRI. The overall pattern is stable from prior. No suspected growing lesion or new edema. The largest deposit is in the anterior right frontal cortex measuring up to 18 mm. These are high density either from hemorrhage or melanin. No superimposed infarct, hemorrhage, hydrocephalus, or shift. Chronic 15 mm cystic structure in the right temporal pole.  Vascular: No hyperdense vessel or unexpected calcification. Visible vessels are patent.  Skull: Negative  Sinuses/Orbits: Negative  IMPRESSION: Known innumerable brain metastases. No detected change from brain MRI 01/17/2017. No acute superimposed finding.    Impression and Plan:   31 year old woman with the following issues:  1. Metastatic melanoma of unknown primary. She presented with CNS metastasis as well as lung, ovarian and lymph node involvement. This is biopsy-proven to be  metastatic melanoma with BRAF status is Positive.  She is currently on Dabrafenib + Trametinib starting on 11/19/2016 and have tolerated it well.   PET CT scan obtained on 01/17/2017 showed excellent response to therapy.  She is clinically declined rapidly in the last few weeks likely related to progression of disease and to the brain with left-sided weakness.  Options of therapy were reviewed today. Given her overall decline I feel the benefit that she would get from any future treatment would be marginal at best. I recommended hospice enrollment at this time given the aggressive nature of her malignancy and declined that she is experiencing. Her life expectancy is limited given with aggressive treatment. I fear any additional therapy will only give her a poorer quality of life moving forward.   2. CNS metastasis:Radiation therapy concluded on 11/12/2016.  I recommended increasing the steroid to 3 times a day.   3. Anxiety: Prescription for Ativan as been made available to the patient.  4. Prognosis: Very poor and limited life expectancy.  5. Follow-up: No follow-up is set up at this time depending on her overall clinical status. If she continues to decline I doubt she'll be able to make any future visits. We will rely on updates from hospice at this time with follow-up as needed.  [facility], MD 7/2/20189:19 AM

## 2017-04-06 NOTE — ED Provider Notes (Signed)
[facility] DEPT Provider Note   CSN: [id number] Arrival date & time: 04/06/17  2104     History   Chief Complaint Chief Complaint  Patient presents with  . Seizures    HPI Julia Luna is a 31 y.o. female.  The history is provided by a parent.  Seizures   This is a new problem. The current episode started 1 to 2 hours ago. Associated symptoms include sleepiness, confusion and speech difficulty. Pertinent negatives include no chest pain. Characteristics include eye deviation, rhythmic jerking and loss of consciousness. Characteristics do not include apnea. The episode was witnessed. The seizures did not continue in the ED. The seizure(s) had no focality (metasteses to the brain). There were no medications administered prior to arrival.    Past Medical History:  Diagnosis Date  . Fibromyalgia   . 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 Osteoarthritis   . Skin cancer    melanoma    Patient Active Problem List   Diagnosis Date Noted  . Goals of care, counseling/discussion 11/03/2016  . Lung metastases ([place]) 10/29/2016  . Metastatic melanoma ([place]) 10/29/2016  . Brain metastases ([place]) 10/25/2016  . Fibromyalgia 10/25/2016  . Unresponsive episode 10/25/2016  . Headache 10/25/2016  . Acute encephalopathy 10/25/2016  . Brain mass 10/23/2016  . Hypokalemia 10/23/2016  . Bradycardia 10/23/2016  . Tobacco use disorder 10/23/2016    Past Surgical History:  Procedure Laterality Date  . CESAREAN SECTION    . CHOLECYSTECTOMY      OB History    No data available       Home Medications    Prior to Admission medications   Medication Sig Start Date End Date Taking? Authorizing Provider  dexamethasone (DECADRON) 4 MG tablet Take 1 tablet (4 mg total) by mouth 4 (four) times daily. Patient taking differently: Take 4 mg by mouth 3 (three) times daily.  12/08/16  Yes Wyatt Portela, MD  DULoxetine (CYMBALTA) 60 MG capsule Take 1 capsule (60 mg total) by  mouth daily. 12/31/16  Yes [name], MD  esomeprazole (NEXIUM) 20 MG capsule Take 20 mg by mouth daily at 12 noon.   Yes [provider]  LORazepam (ATIVAN) 1 MG tablet Take 1 tablet (1 mg total) by mouth every 8 (eight) hours as needed. for anxiety 03/16/17  Yes Shadad, Mathis Dad, MD  meloxicam (MOBIC) 15 MG tablet Take 1 tablet (15 mg total) by mouth daily. 03/21/17  Yes Drenda Freeze, MD  methocarbamol (ROBAXIN) 500 MG tablet Take 1 tablet (500 mg total) by mouth every 8 (eight) hours as needed for muscle spasms. 03/16/17  Yes Wyatt Portela, MD  morphine (MSIR) 15 MG tablet Take 1 tablet (15 mg total) by mouth every 6 (six) hours as needed for severe pain. 11/12/16  Yes Wyatt Portela, MD  ondansetron (ZOFRAN-ODT) 8 MG disintegrating tablet Take 0.5 tablets (4 mg total) by mouth every 8 (eight) hours as needed for nausea or vomiting (place under tongue). 03/16/17  Yes Shadad, Mathis Dad, MD  butalbital-acetaminophen-caffeine (FIORICET, ESGIC) 50-325-40 MG tablet Take 1 tablet by mouth every 4 (four) hours as needed for headache. Patient not taking: Reported on 04/06/2017 10/28/16   Lavina Hamman, MD  dabrafenib mesylate (TAFINLAR) 75 MG capsule Take 2 capsules (150 mg total) by mouth 2 (two) times daily. Take on an empty stomach 1 hour before or 2 hours after meals. Patient not taking: Reported on 04/06/2017 03/16/17   Wyatt Portela, MD  polyethylene glycol (MIRALAX / GLYCOLAX) packet Take 17 g by mouth daily. Patient not taking: Reported on 04/06/2017 10/28/16   Lavina Hamman, MD  prochlorperazine (COMPAZINE) 10 MG tablet Take 1 tablet (10 mg total) by mouth every 6 (six) hours as needed for nausea or vomiting. Patient not taking: Reported on 03/21/2017 11/24/16   Wyatt Portela, MD  trametinib dimethyl sulfoxide (MEKINIST) 2 MG tablet Take 1 tablet (2 mg total) by mouth daily. Take 1 hour before or 2 hours after meals. Store refrigerated in original container. Patient not taking: Reported  on 04/06/2017 03/16/17   Wyatt Portela, MD    Family History Family History  Problem Relation Age of Onset  . Thyroid cancer Mother   . Cardiomyopathy Father     Social History Social History  Substance Use Topics  . Smoking status: Current Every Day Smoker    Packs/day: 0.75    Years: 10.00    Types: Cigarettes  . Smokeless tobacco: Never Used  . Alcohol use Yes     Comment: Socially      Allergies   Other and Penicillins   Review of Systems Review of Systems  Constitutional: Negative for activity change.  HENT: Negative for nosebleeds.   Eyes: Negative for discharge.  Respiratory: Negative for apnea, shortness of breath and wheezing.   Cardiovascular: Negative for chest pain.  Gastrointestinal: Negative for blood in stool.  Genitourinary: Negative for hematuria.  Musculoskeletal: Negative for arthralgias.  Skin: Negative for wound.  Allergic/Immunologic: Positive for immunocompromised state.  Neurological: Positive for seizures, loss of consciousness and speech difficulty. Negative for facial asymmetry.  Psychiatric/Behavioral: Positive for confusion.     Physical Exam Updated Vital Signs BP 112/78   Pulse 92   Temp 98.3 F (36.8 C) (Rectal)   Resp 20   SpO2 96%   Physical Exam  Constitutional: She appears well-developed and well-nourished. No distress.  HENT:  Head: Normocephalic and atraumatic.  Eyes: Conjunctivae are normal. No scleral icterus.  Neck: Neck supple. No tracheal deviation present.  Cardiovascular: Normal rate and regular rhythm.   No murmur heard. Pulmonary/Chest: Effort normal and breath sounds normal. No respiratory distress.  Abdominal: Soft. She exhibits no distension.  Musculoskeletal: She exhibits no edema or deformity.  Neurological:  Will open her eyes to her name  Skin: Skin is warm and dry. She is not diaphoretic.  Psychiatric: She has a normal mood and affect.  Nursing note and vitals reviewed.    ED Treatments /  Results  Labs (all labs ordered are listed, but only abnormal results are displayed) Labs Reviewed  BASIC METABOLIC PANEL - Abnormal; Notable for the following:       Result Value   Glucose, Bld 109 (*)    All other components within normal limits  CBC - Abnormal; Notable for the following:    WBC 3.1 (*)    Platelets 133 (*)    All other components within normal limits  CBG MONITORING, ED - Abnormal; Notable for the following:    Glucose-Capillary 107 (*)    All other components within normal limits  I-STAT BETA HCG BLOOD, ED ([facility], [facility], [facility] ONLY)    EKG  EKG Interpretation None       Radiology No results found.  Procedures Procedures (including critical care time)  Medications Ordered in ED Medications  levETIRAcetam (KEPPRA) 1,000 mg in sodium chloride 0.9 % 100 mL IVPB (1,000 mg Intravenous New Bag/Given 04/06/17 2217)  LORazepam (ATIVAN) injection 1 mg (  1 mg Intravenous Given 04/06/17 2217)     Initial Impression / Assessment and Plan / ED Course  I have reviewed the triage vital signs and the nursing notes.  Pertinent labs & imaging results that were available during my care of the patient were reviewed by me and considered in my medical decision making (see chart for details).     Julia Luna is a 31 y.o. female history of malignant melanoma with metastases to the brain presenting with Seizure-like activity. Father states that they met with hospice this morning and during dinner time she began having tightening of her muscles as well as repetitive head movements with repetitive noises consistent with seizure. Dad also states her eyes were moving around a lot. Of note, patient presented here a couple of weeks ago where she received a CT scan that was negative for worsening of her current condition.  On exam she does appear somnolent and will not respond to my questions. Vital signs are stable and within normal limits. Occasional twitches are noted. Hospice nurse  arrived and is coordinating with having the patient transferred to [facility] for hospice care. Condition given 1g Keppra load as well as 1 mg Ativan here in the emergency department. Patient still having occasional left upper arm twitches. Patient was discharged in stable condition with transfer to [facility].   Patient was seen with my attending, Dr. Venora Maples, who voiced agreement and oversaw the evaluation and treatment of this patient.  Dr[employer] seismologist was used in the creation of this note. If there are any errors or inconsistencies needing clarification, please contact me directly.   Final Clinical Impressions(s) / ED Diagnoses   Final diagnoses:  Seizure-like activity [facility])    New Prescriptions New Prescriptions   No medications on file     Valda Lamb, MD 04/06/17 2312    Jola Schmidt, MD 04/07/17 [phone]

## 2017-04-06 NOTE — ED Notes (Signed)
Unable to obtain enough blood to collect I-stat

## 2017-04-06 NOTE — Telephone Encounter (Signed)
Agree. Thanks

## 2017-04-06 NOTE — Consult Note (Signed)
Consultation Note Date: 04/06/2017   Patient Name: Julia Luna  DOB: 07-11-1986  MRN: [id number]  Age / Sex: 31 y.o., female  PCP: [name], MD Referring Physician: Knox Royalty, NP  Reason for Consultation: Establish GOC, emotional support   HPI/Patient Profile: 31 y.o. female  with past medical history of metastatic melanoma diagnosed in January 2018, she presented with brain metastasis, lung lesions, and bilateral ovarian masses. BRAF status is positive.  Dr Joelyn Oms is her oncologist, today options of therapy were reviewed with patient and family on office visit.  She is currently on Dabrafenib + Trametinib starting on 11/19/2016, however oncology feels that given her overall decline the benefit that she would get from any future treatment would be marginal at best.   Dr Alen Blew recommended hospice enrollment at this time  given the aggressive nature of her malignancy and decline. Her life expectancy is limited given with aggressive treatment. I fear any additional therapy will only give her a poorer quality of life moving forward.  Patient and family face the reality of limited options for life prolonging treatments at this time and  the mortality of this young women.  Clinical Assessment and Goals of Care:   This NP Wadie Lessen reviewed medical records, received report from team, assessed the patient and then meet at the patient  along with her parents in the OP radiation-oncology  Clinic  to discuss diagnosis, prognosis, GOC, EOL wishes  and options.  A  discussion was had today regarding advanced directives.  Concepts specific to code status, artifical feeding and hydration, continued IV antibiotics and rehospitalization was had.  The difference between a aggressive medical intervention path  and a palliative comfort care path for this patient at this time was had.  Values and goals of care  important to patient and family were attempted to be elicited.  Concept of Hospice and Palliative Care were discussed  Natural trajectory and expectations at EOL were discussed.  Questions and concerns addressed.   Family encouraged to call with questions or concerns.  PMT will continue to support holistically.   SUMMARY OF RECOMMENDATIONS    Code Status/Advance Care Planning:   DNR   Symptom Management:   Hospice will be initiated today, they will review all medications and make adjustments to maximize comfort for Ilaisaane.  Palliative Prophylaxis:   Aspiration, Frequent Pain Assessment and Oral Care  Additional Recommendations (Limitations, Scope, Preferences):  Full Comfort Care  Psycho-social/Spiritual:   Desire for further Chaplaincy support:no  Additional Recommendations: Education on Hospice  Prognosis:   < 3 months  At best  Discharge Planning:  I called referral to [facility],   I stressed the importance of admission today.  I believe she and her family will need the support of [place] sooner than later   Home with Hospice       Primary Diagnoses: Present on Admission: **None**   I have reviewed the medical record, interviewed the patient and family, and examined the patient. The following aspects are pertinent.  Past Medical History:  Diagnosis Date  . Fibromyalgia   . 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 Osteoarthritis   . Skin cancer    melanoma   Social History   Social History  . Marital status: Single    Spouse name: N/A  . Number of children: N/A  . Years of education: N/A   Social History Main Topics  . Smoking status: Current Every Day Smoker    Packs/day: 0.75    Years: 10.00    Types: Cigarettes  . Smokeless tobacco: Never Used  . Alcohol use Yes     Comment: Socially   . Drug use: No  . Sexual activity: Not on file   Other Topics Concern  . Not on file   Social History Narrative  . No narrative on  file   Family History  Problem Relation Age of Onset  . Thyroid cancer Mother   . Cardiomyopathy Father    Scheduled Meds: Continuous Infusions: PRN Meds:. Medications Prior to Admission:  Prior to Admission medications   Medication Sig Start Date End Date Taking? Authorizing Provider  butalbital-acetaminophen-caffeine (FIORICET, ESGIC) 50-325-40 MG tablet Take 1 tablet by mouth every 4 (four) hours as needed for headache. Patient not taking: Reported on 04/06/2017 10/28/16   Lavina Hamman, MD  Cholecalciferol (D3 HIGH POTENCY) 2000 units CAPS Take 2,000 Units by mouth daily.    [provider]  CYMBALTA 60 MG capsule TAKE 1 CAPSULE BY MOUTH ONCE DAILY 03/20/17   [provider]  dabrafenib mesylate (TAFINLAR) 75 MG capsule Take 2 capsules (150 mg total) by mouth 2 (two) times daily. Take on an empty stomach 1 hour before or 2 hours after meals. Patient not taking: Reported on 04/06/2017 03/16/17   Wyatt Portela, MD  dexamethasone (DECADRON) 4 MG tablet Take 1 tablet (4 mg total) by mouth 4 (four) times daily. Patient not taking: Reported on 04/06/2017 12/08/16   Wyatt Portela, MD  DULoxetine (CYMBALTA) 60 MG capsule Take 1 capsule (60 mg total) by mouth daily. Patient not taking: Reported on 04/06/2017 12/31/16   [name], MD  esomeprazole (NEXIUM) 20 MG capsule Take 20 mg by mouth daily at 12 noon.    [provider]  LORazepam (ATIVAN) 1 MG tablet Take 1 tablet (1 mg total) by mouth every 8 (eight) hours as needed. for anxiety Patient not taking: Reported on 04/06/2017 03/16/17   Wyatt Portela, MD  meloxicam (MOBIC) 15 MG tablet Take 1 tablet (15 mg total) by mouth daily. Patient not taking: Reported on 04/06/2017 03/21/17   Drenda Freeze, MD  methocarbamol (ROBAXIN) 500 MG tablet Take 1 tablet (500 mg total) by mouth every 8 (eight) hours as needed for muscle spasms. Patient not taking: Reported on 04/06/2017 03/16/17   Wyatt Portela, MD  Misc Natural  Products (COSAMIN ASU ADVANCED FORMULA PO) Take 2 tablets by mouth daily.    [provider]  morphine (MSIR) 15 MG tablet Take 1 tablet (15 mg total) by mouth every 6 (six) hours as needed for severe pain. Patient not taking: Reported on 03/21/2017 11/12/16   Wyatt Portela, MD  ondansetron (ZOFRAN-ODT) 8 MG disintegrating tablet Take 0.5 tablets (4 mg total) by mouth every 8 (eight) hours as needed for nausea or vomiting (place under tongue). Patient not taking: Reported on 04/06/2017 03/16/17   Wyatt Portela, MD  polyethylene glycol [facility]avel[facility]) packet Take 17 g by mouth daily. Patient not  taking: Reported on 04/06/2017 10/28/16   Lavina Hamman, MD  prochlorperazine (COMPAZINE) 10 MG tablet Take 1 tablet (10 mg total) by mouth every 6 (six) hours as needed for nausea or vomiting. Patient not taking: Reported on 03/21/2017 11/24/16   Wyatt Portela, MD  trametinib dimethyl sulfoxide (MEKINIST) 2 MG tablet Take 1 tablet (2 mg total) by mouth daily. Take 1 hour before or 2 hours after meals. Store refrigerated in original container. Patient not taking: Reported on 04/06/2017 03/16/17   Wyatt Portela, MD   Allergies  Allergen Reactions  . Other Hives    NO "-CILLIN(s)"  . Penicillins Hives    Has patient had a PCN reaction causing immediate rash, facial/tongue/throat swelling, SOB or lightheadedness with hypotension: Yes Has patient had a PCN reaction causing severe rash involving mucus membranes or skin necrosis: No Has patient had a PCN reaction that required hospitalization: No Has patient had a PCN reaction occurring within the last 10 years: No If all of the above answers are "NO", then may proceed with Cephalosporin use.    Review of Systems  Unable to perform ROS: Mental status change    Physical Exam  Constitutional: She appears listless. She appears ill.  HENT:  Mouth/Throat: Oropharynx is clear and moist.  Neurological: She appears listless. She displays atrophy. A  cranial nerve deficit is present. Coordination and gait abnormal.  Skin: Skin is warm and dry.  -noted alopecia 2/2 cancer treatments     Vital Signs: There were no vitals taken for this visit.         SpO2:   O2 Device:  O2 Flow Rate: .   IO: Intake/output summary: No intake or output data in the 24 hours ending 04/06/17 1041  LBM:   Baseline Weight:   Most recent weight:       Palliative Assessment/Data: 30% at best   Discussed Dr Johny Shears RN and left message for Dr Alen Blew  Time In: 0900 Time Out: 1015 Time Total: 75 min Greater than 50%  of this time was spent counseling and coordinating care related to the above assessment and plan.  Signed by: Wadie Lessen, NP   Please contact Palliative Medicine Team phone at [phone] for questions and concerns.  For individual provider: See Shea Evans

## 2017-04-06 NOTE — ED Triage Notes (Signed)
Pt arrives via EMS for seizure like activity, described as "tightening up her muscles." Witnessed by family. Hx stage four melanoma, actively being treated. Met with hospice today.

## 2017-04-07 DIAGNOSIS — Z515 Encounter for palliative care: Secondary | ICD-10-CM | POA: Insufficient documentation

## 2017-04-07 DIAGNOSIS — Z66 Do not resuscitate: Secondary | ICD-10-CM | POA: Insufficient documentation

## 2017-04-07 NOTE — ED Notes (Signed)
Patient left at this time with all belongings. 

## 2017-04-07 NOTE — ED Notes (Signed)
CALLED [facility] FOR TRANSPORT TO [facility]--Julia Luna

## 2017-04-14 ENCOUNTER — Telehealth: Payer: Self-pay | Admitting: *Deleted

## 2017-04-14 NOTE — Telephone Encounter (Signed)
Received faxed notice from [facility] that patient passed away 05-05-2017 @ 8:28 pm @ [employer]. Will inform MD. Message sent to scheduling to cancel all future appointments.

## 2017-04-23 ENCOUNTER — Encounter: Payer: Self-pay | Admitting: Radiation Therapy

## 2017-04-23 NOTE — Progress Notes (Signed)
Pt expired on 2017-04-18 Below is a clip from her obituary

## 2017-04-30 ENCOUNTER — Other Ambulatory Visit: Payer: Medicaid Other

## 2017-05-04 ENCOUNTER — Ambulatory Visit: Payer: Self-pay | Admitting: Radiation Oncology

## 2017-05-04 ENCOUNTER — Ambulatory Visit: Payer: Medicaid Other

## 2017-05-06 DEATH — deceased

## 2017-06-03 ENCOUNTER — Ambulatory Visit: Payer: Medicaid Other | Admitting: Cardiology

## 2019-02-10 IMAGING — MR MR HEAD WO/W CM
11 series · 48 of 48 positions shown · IV contrast (multihance)
Comparison: Pretreatment brain MRI 10/23/2016

CLINICAL DATA: 31-year-old female with metastatic melanoma,
including innumerable brain metastases, is 9 weeks status post whole
brain radiation completed on 11/12/2016.

EXAM:
MRI HEAD WITHOUT AND WITH CONTRAST
TECHNIQUE: Multiplanar, multiecho pulse sequences of the brain and surrounding
structures were obtained without and with intravenous contrast.
CONTRAST:  20mL MULTIHANCE GADOBENATE DIMEGLUMINE 529 MG/ML IV SOLN

[Series 2: FLAIR · sagittal · 3.0mm · 0.75mm/px · 2 of 39 slices shown (1 of 2)]
[im 1/39]
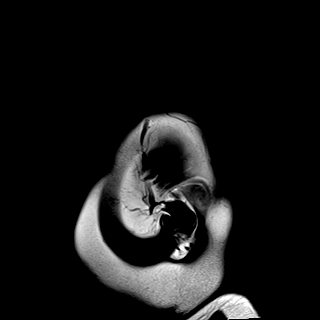
[im 39/39]
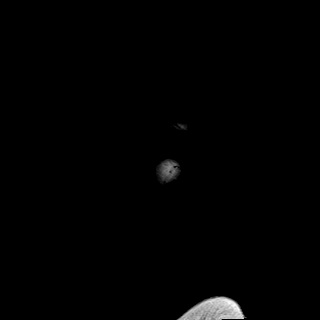

[Series 3: DWI · axial · 3.0mm · 1.50mm/px · z∈[-71,+77]mm · 4 of 78 slices shown (1 of 2)]
[im 1/78]
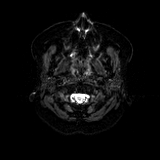
[im 26/78]
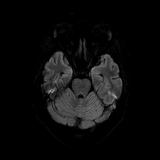
[im 52/78]
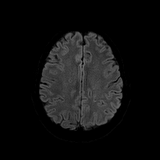
[im 78/78]
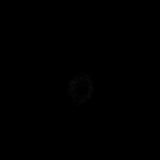

[Series 4: DWI · axial · 3.0mm · 1.50mm/px · z∈[-71,+77]mm · 3 of 38 slices shown (2 of 2)]
[im 1/38]
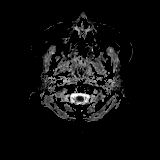
[im 19/38]
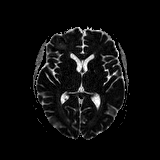
[im 38/38]
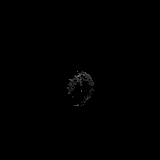

[Series 6: GRE · axial · 5.0mm · 0.57mm/px · z∈[-74,+81]mm · 2 of 27 slices shown]
[im 1/27]
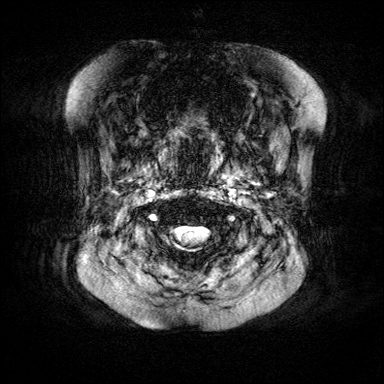
[im 27/27]
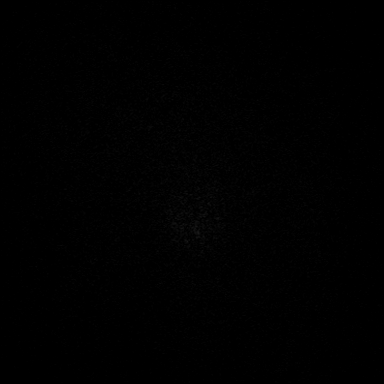

[Series 7: T2 · axial · 5.0mm · 0.57mm/px · z∈[-74,+81]mm · 2 of 27 slices shown]
[im 1/27]
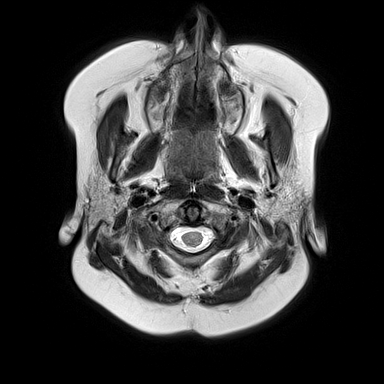
[im 27/27]
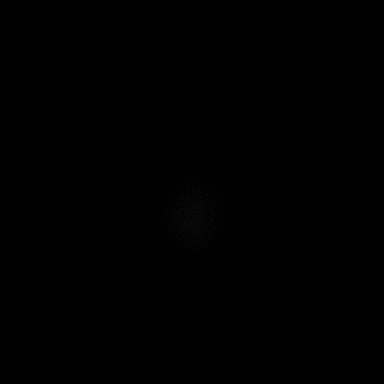

[Series 8: FLAIR · axial · 3.0mm · 0.57mm/px · z∈[-71,+82]mm · 4 of 52 slices shown (2 of 2)]
[im 1/52]
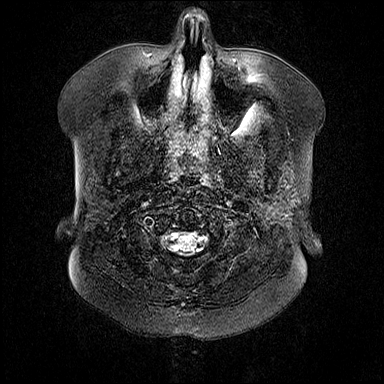
[im 18/52]
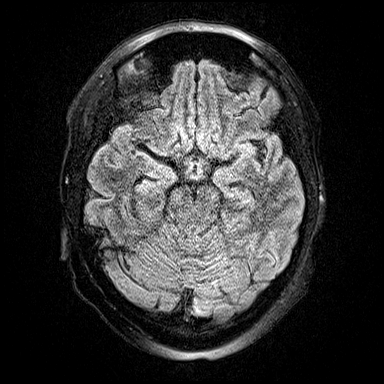
[im 35/52]
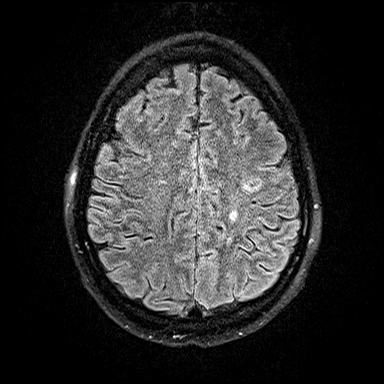
[im 52/52]
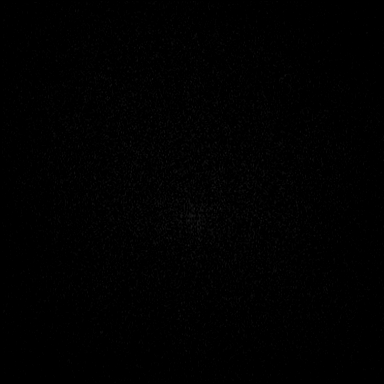

[Series 9: T1 · axial · 1.0mm · 0.75mm/px · z∈[-74,+85]mm · 11 of 158 slices shown]
[im 1/158]
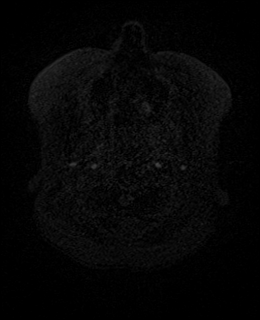
[im 16/158]
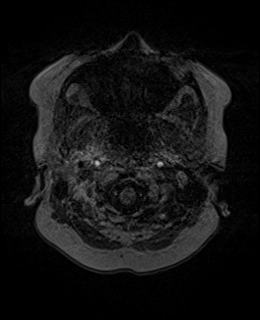
[im 32/158]
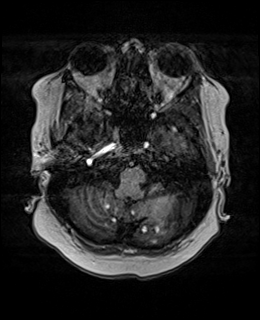
[im 48/158]
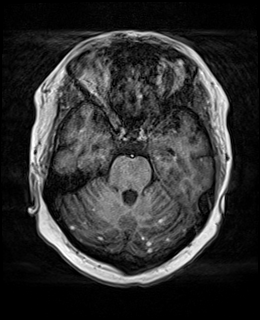
[im 63/158]
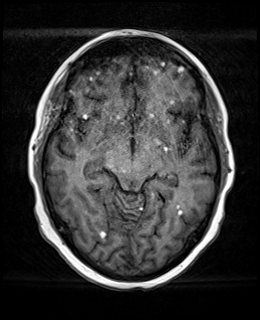
[im 79/158]
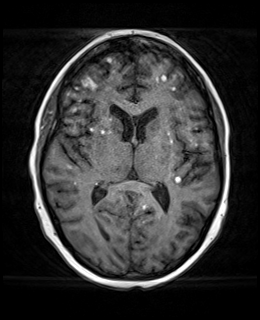
[im 95/158]
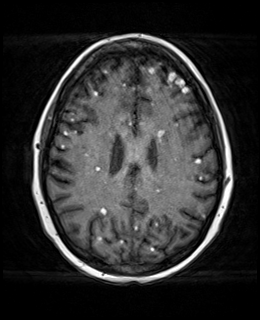
[im 110/158]
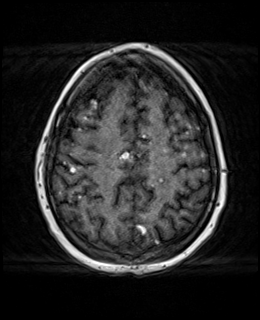
[im 126/158]
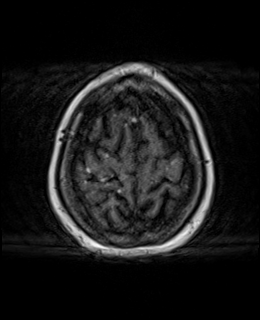
[im 142/158]
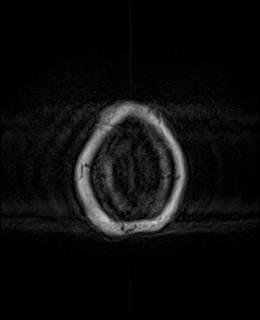
[im 158/158]
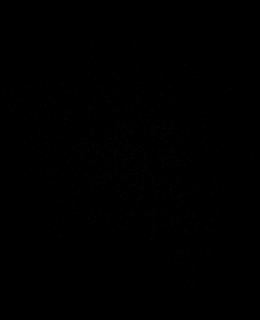

[Series 11: T2 post-contrast · coronal · 3.0mm · 0.57mm/px · 3 of 47 slices shown]
[im 1/47]
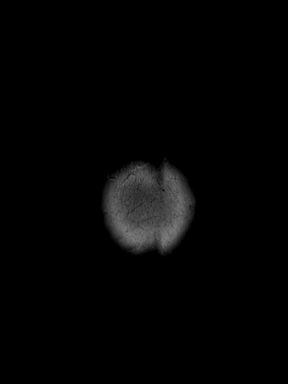
[im 24/47]
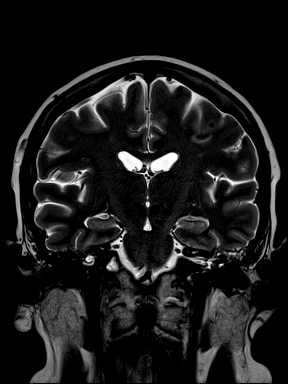
[im 47/47]
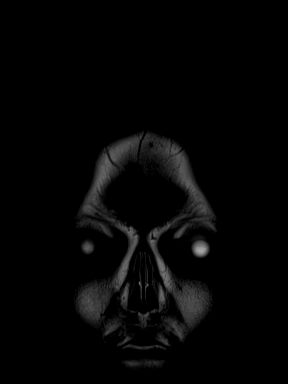

[Series 12: T1 post-contrast · axial · 1.0mm · 0.75mm/px · z∈[-75,+84]mm · 11 of 160 slices shown (1 of 2)]
[im 1/160]
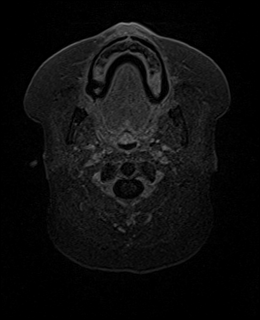
[im 16/160]
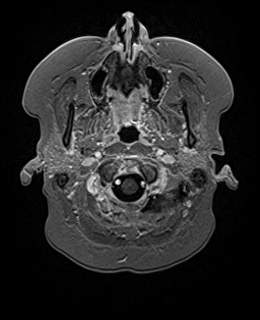
[im 32/160]
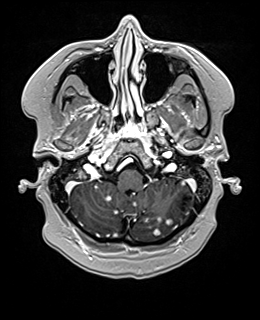
[im 48/160]
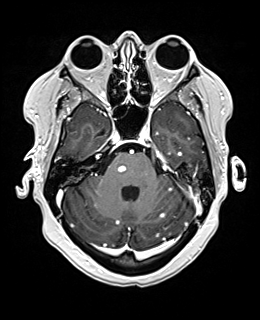
[im 64/160]
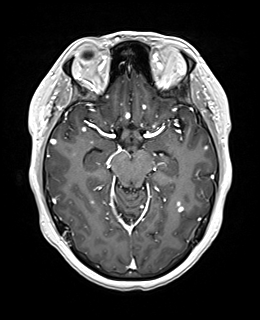
[im 80/160]
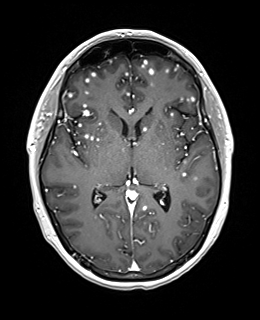
[im 96/160]
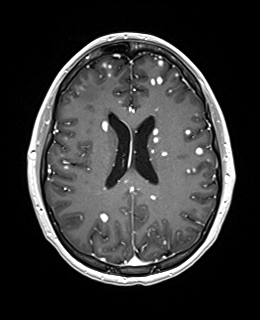
[im 112/160]
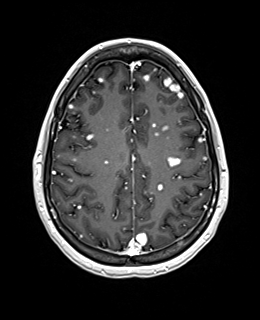
[im 128/160]
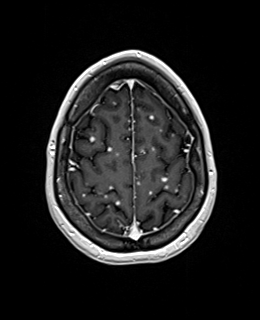
[im 144/160]
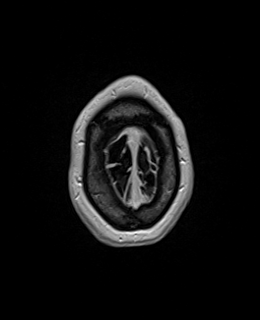
[im 160/160]
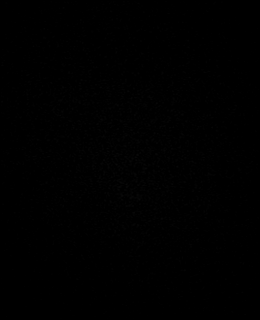

[Series 13: T1 post-contrast · coronal · 3.0mm · 0.57mm/px · 3 of 47 slices shown (2 of 2)]
[im 1/47]
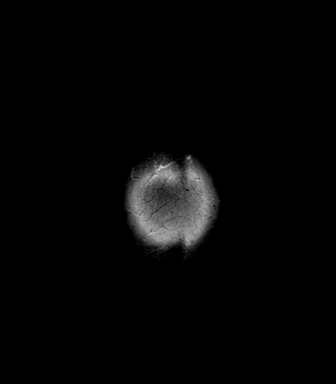
[im 24/47]
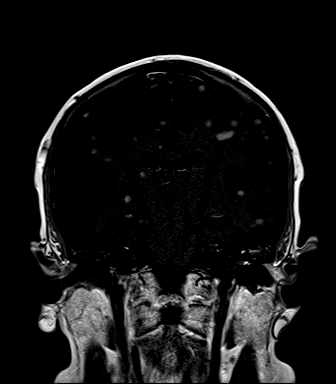
[im 47/47]
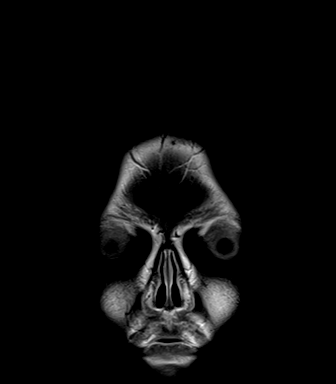

[Series 14: FLAIR post-contrast · sagittal · 3.0mm · 0.75mm/px · 3 of 39 slices shown]
[im 1/39]
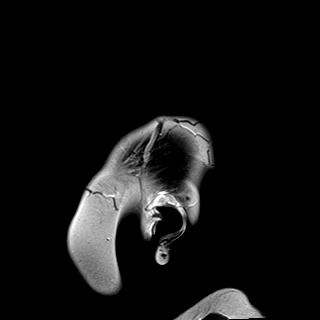
[im 20/39]
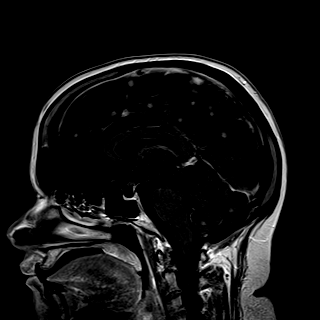
[im 39/39]
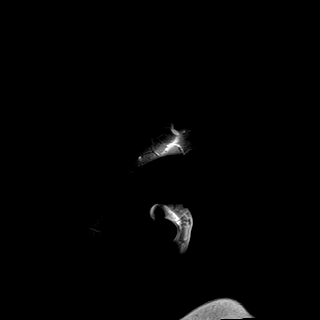

[48 of 48 positions shown; findings below may reference images not displayed]

FINDINGS: Brain: Regressed and largely resolved multifocal brain edema in both
hemispheres since the pretreatment study. Residual widespread T2 and
FLAIR hyperintensity now mostly related to the individual melanoma
metastases.

T1 intrinsic and enhancing innumerable enhancing brain metastases
persist (series 12) with bilateral cerebellar hemisphere, cerebral
hemisphere, and bilateral deep gray matter nuclei and brainstem
involvement. However, the largest lesions are all decreased in size
since the prior study
(e.g.  right parietal vertex previously 22 mm now 12 mm,
posterior left parietal lobe previously 22 mm now 11 mm,
right anterior inferior frontal lobe previously 25 mm, now 20 mL),
and
left anterior superior frontal lobe previously 12 mm now 9 mm),

No interval lesion enlargement identified, and the overall number of
metastases does not seem increased.

Although there are numerous peripherally located lesions there is no
leptomeningeal thickening or enhancement, and no abnormal
pachymeningeal thickening or enhancement.

Many of the lesions demonstrate petechial hemorrhage as before. No
malignant hemorrhagic transformation.

Improved ventricular patency with no ventriculomegaly. No midline
shift. Basilar cisterns are patent. Negative pituitary. No
restricted diffusion or evidence of acute infarction.

Vascular: Major intracranial vascular flow voids are stable to
improved, dural venous sinus patency appears improved.

Skull and upper cervical spine: Visible bone marrow signal remains
within normal limits. Negative visualized cervical spine and spinal
cord.

Sinuses/Orbits: Stable and negative.

Other: Bilateral mastoid effusions have mildly increased. Other
visible internal auditory structures are within normal limits. No
suspicious scalp soft tissue lesion.
IMPRESSION: 1. Persistent innumerable melanoma metastases throughout the brain,
but some positive response to therapy including:
largely resolved cerebral edema, decreased size of the largest
metastases, and no increase in the overall number of metastases
since the pretreatment MRI.
2. No meningeal involvement. No skull metastasis identified, and
negative visualized cervical spine and spinal cord.
